# Patient Record
Sex: Female | Born: 1945 | Race: Black or African American | Hispanic: No | Marital: Single | Smoking: Never smoker
Health system: Southern US, Community
[De-identification: ages and names within clinical notes are randomized; demographics above are authoritative.]

## PROBLEM LIST (undated history)

## (undated) DIAGNOSIS — K219 Gastro-esophageal reflux disease without esophagitis: Secondary | ICD-10-CM

## (undated) DIAGNOSIS — E079 Disorder of thyroid, unspecified: Secondary | ICD-10-CM

## (undated) DIAGNOSIS — T7840XA Allergy, unspecified, initial encounter: Secondary | ICD-10-CM

## (undated) DIAGNOSIS — K635 Polyp of colon: Secondary | ICD-10-CM

## (undated) DIAGNOSIS — E785 Hyperlipidemia, unspecified: Secondary | ICD-10-CM

## (undated) HISTORY — PX: UPPER GI ENDOSCOPY: SHX6162

## (undated) HISTORY — DX: Polyp of colon: K63.5

## (undated) HISTORY — DX: Gastro-esophageal reflux disease without esophagitis: K21.9

## (undated) HISTORY — DX: Allergy, unspecified, initial encounter: T78.40XA

## (undated) HISTORY — DX: Hyperlipidemia, unspecified: E78.5

## (undated) HISTORY — DX: Disorder of thyroid, unspecified: E07.9

---

## 1971-03-29 HISTORY — PX: OTHER SURGICAL HISTORY: SHX169

## 2002-03-28 HISTORY — PX: COLONOSCOPY W/ POLYPECTOMY: SHX1380

## 2005-03-28 HISTORY — PX: OTHER SURGICAL HISTORY: SHX169

## 2006-01-30 ENCOUNTER — Encounter: Payer: Self-pay | Admitting: Internal Medicine

## 2006-03-27 ENCOUNTER — Ambulatory Visit (HOSPITAL_COMMUNITY): Admission: RE | Admit: 2006-03-27 | Discharge: 2006-03-27 | Payer: Self-pay | Admitting: Obstetrics and Gynecology

## 2008-03-06 ENCOUNTER — Encounter: Payer: Self-pay | Admitting: Internal Medicine

## 2008-03-13 ENCOUNTER — Encounter: Admission: RE | Admit: 2008-03-13 | Discharge: 2008-03-13 | Payer: Self-pay

## 2008-11-07 ENCOUNTER — Encounter (INDEPENDENT_AMBULATORY_CARE_PROVIDER_SITE_OTHER): Payer: Self-pay | Admitting: *Deleted

## 2008-11-28 ENCOUNTER — Ambulatory Visit (HOSPITAL_COMMUNITY): Admission: RE | Admit: 2008-11-28 | Discharge: 2008-11-28 | Payer: Self-pay | Admitting: Obstetrics and Gynecology

## 2008-12-12 ENCOUNTER — Ambulatory Visit: Payer: Self-pay | Admitting: Internal Medicine

## 2008-12-12 DIAGNOSIS — E785 Hyperlipidemia, unspecified: Secondary | ICD-10-CM | POA: Insufficient documentation

## 2008-12-12 DIAGNOSIS — K219 Gastro-esophageal reflux disease without esophagitis: Secondary | ICD-10-CM | POA: Insufficient documentation

## 2008-12-12 DIAGNOSIS — E039 Hypothyroidism, unspecified: Secondary | ICD-10-CM | POA: Insufficient documentation

## 2008-12-12 DIAGNOSIS — Z860100 Personal history of colon polyps, unspecified: Secondary | ICD-10-CM | POA: Insufficient documentation

## 2008-12-12 DIAGNOSIS — J309 Allergic rhinitis, unspecified: Secondary | ICD-10-CM | POA: Insufficient documentation

## 2008-12-12 DIAGNOSIS — K8020 Calculus of gallbladder without cholecystitis without obstruction: Secondary | ICD-10-CM | POA: Insufficient documentation

## 2008-12-12 DIAGNOSIS — K2270 Barrett's esophagus without dysplasia: Secondary | ICD-10-CM | POA: Insufficient documentation

## 2008-12-14 LAB — CONVERTED CEMR LAB: Vit D, 25-Hydroxy: 41 ng/mL (ref 30–89)

## 2008-12-15 ENCOUNTER — Encounter (INDEPENDENT_AMBULATORY_CARE_PROVIDER_SITE_OTHER): Payer: Self-pay | Admitting: *Deleted

## 2008-12-16 ENCOUNTER — Telehealth (INDEPENDENT_AMBULATORY_CARE_PROVIDER_SITE_OTHER): Payer: Self-pay | Admitting: *Deleted

## 2008-12-16 ENCOUNTER — Encounter (INDEPENDENT_AMBULATORY_CARE_PROVIDER_SITE_OTHER): Payer: Self-pay | Admitting: *Deleted

## 2008-12-16 LAB — CONVERTED CEMR LAB
ALT: 17 units/L (ref 0–35)
AST: 18 units/L (ref 0–37)
Albumin: 3.6 g/dL (ref 3.5–5.2)
Alkaline Phosphatase: 72 units/L (ref 39–117)
BUN: 17 mg/dL (ref 6–23)
Basophils Absolute: 0.1 10*3/uL (ref 0.0–0.1)
Basophils Relative: 1.2 % (ref 0.0–3.0)
Bilirubin, Direct: 0.1 mg/dL (ref 0.0–0.3)
CO2: 32 meq/L (ref 19–32)
Calcium: 9.3 mg/dL (ref 8.4–10.5)
Chloride: 106 meq/L (ref 96–112)
Cholesterol: 193 mg/dL (ref 0–200)
Creatinine, Ser: 0.8 mg/dL (ref 0.4–1.2)
Eosinophils Absolute: 0.1 10*3/uL (ref 0.0–0.7)
Eosinophils Relative: 2.7 % (ref 0.0–5.0)
GFR calc non Af Amer: 93.13 mL/min (ref >60)
Glucose, Bld: 84 mg/dL (ref 70–99)
HCT: 37.1 % (ref 36.0–46.0)
HDL: 40.4 mg/dL (ref >39.00)
Hemoglobin: 12.4 g/dL (ref 12.0–15.0)
LDL Cholesterol: 135 mg/dL — ABNORMAL HIGH (ref 0–99)
Lymphocytes Relative: 47.5 % — ABNORMAL HIGH (ref 12.0–46.0)
Lymphs Abs: 2 10*3/uL (ref 0.7–4.0)
MCHC: 33.4 g/dL (ref 30.0–36.0)
MCV: 93.4 fL (ref 78.0–100.0)
Monocytes Absolute: 0.3 10*3/uL (ref 0.1–1.0)
Monocytes Relative: 5.9 % (ref 3.0–12.0)
Neutro Abs: 1.9 10*3/uL (ref 1.4–7.7)
Neutrophils Relative %: 42.7 % — ABNORMAL LOW (ref 43.0–77.0)
Platelets: 323 10*3/uL (ref 150.0–400.0)
Potassium: 4.3 meq/L (ref 3.5–5.1)
RBC: 3.98 M/uL (ref 3.87–5.11)
RDW: 13.6 % (ref 11.5–14.6)
Sodium: 143 meq/L (ref 135–145)
TSH: 0.1 microintl units/mL — ABNORMAL LOW (ref 0.35–5.50)
Total Bilirubin: 1 mg/dL (ref 0.3–1.2)
Total CHOL/HDL Ratio: 5
Total Protein: 8 g/dL (ref 6.0–8.3)
Triglycerides: 90 mg/dL (ref 0.0–149.0)
VLDL: 18 mg/dL (ref 0.0–40.0)
WBC: 4.4 10*3/uL — ABNORMAL LOW (ref 4.5–10.5)

## 2009-01-05 ENCOUNTER — Telehealth (INDEPENDENT_AMBULATORY_CARE_PROVIDER_SITE_OTHER): Payer: Self-pay | Admitting: *Deleted

## 2009-02-24 ENCOUNTER — Ambulatory Visit: Payer: Self-pay | Admitting: Internal Medicine

## 2009-02-24 LAB — CONVERTED CEMR LAB
ALT: 19 units/L (ref 0–35)
AST: 19 units/L (ref 0–37)
Albumin: 3.6 g/dL (ref 3.5–5.2)
Alkaline Phosphatase: 72 units/L (ref 39–117)
Bilirubin, Direct: 0 mg/dL (ref 0.0–0.3)
Cholesterol: 160 mg/dL (ref 0–200)
HDL: 39.4 mg/dL (ref >39.00)
LDL Cholesterol: 100 mg/dL — ABNORMAL HIGH (ref 0–99)
TSH: 0.39 microintl units/mL (ref 0.35–5.50)
Total Bilirubin: 1 mg/dL (ref 0.3–1.2)
Total CHOL/HDL Ratio: 4
Total Protein: 7.9 g/dL (ref 6.0–8.3)
Triglycerides: 105 mg/dL (ref 0.0–149.0)
VLDL: 21 mg/dL (ref 0.0–40.0)

## 2009-02-27 ENCOUNTER — Ambulatory Visit: Payer: Self-pay | Admitting: Internal Medicine

## 2009-07-08 ENCOUNTER — Ambulatory Visit: Payer: Self-pay | Admitting: Internal Medicine

## 2009-07-08 DIAGNOSIS — E8881 Metabolic syndrome: Secondary | ICD-10-CM | POA: Insufficient documentation

## 2009-07-08 DIAGNOSIS — N959 Unspecified menopausal and perimenopausal disorder: Secondary | ICD-10-CM | POA: Insufficient documentation

## 2009-07-10 ENCOUNTER — Encounter (INDEPENDENT_AMBULATORY_CARE_PROVIDER_SITE_OTHER): Payer: Self-pay | Admitting: *Deleted

## 2009-07-13 LAB — CONVERTED CEMR LAB
Basophils Absolute: 0.1 10*3/uL (ref 0.0–0.1)
Basophils Relative: 1.6 % (ref 0.0–3.0)
Eosinophils Absolute: 0.2 10*3/uL (ref 0.0–0.7)
Eosinophils Relative: 4.2 % (ref 0.0–5.0)
HCT: 35.4 % — ABNORMAL LOW (ref 36.0–46.0)
Hemoglobin: 12 g/dL (ref 12.0–15.0)
Hgb A1c MFr Bld: 5.3 % (ref 4.6–6.5)
Lymphocytes Relative: 38.6 % (ref 12.0–46.0)
Lymphs Abs: 1.7 10*3/uL (ref 0.7–4.0)
MCHC: 34 g/dL (ref 30.0–36.0)
MCV: 92.9 fL (ref 78.0–100.0)
Monocytes Absolute: 0.2 10*3/uL (ref 0.1–1.0)
Monocytes Relative: 5.2 % (ref 3.0–12.0)
Neutro Abs: 2.3 10*3/uL (ref 1.4–7.7)
Neutrophils Relative %: 50.4 % (ref 43.0–77.0)
Platelets: 375 10*3/uL (ref 150.0–400.0)
RBC: 3.81 M/uL — ABNORMAL LOW (ref 3.87–5.11)
RDW: 15 % — ABNORMAL HIGH (ref 11.5–14.6)
TSH: 0.95 microintl units/mL (ref 0.35–5.50)
Vit D, 25-Hydroxy: 32 ng/mL (ref 30–89)
WBC: 4.5 10*3/uL (ref 4.5–10.5)

## 2009-07-27 ENCOUNTER — Telehealth (INDEPENDENT_AMBULATORY_CARE_PROVIDER_SITE_OTHER): Payer: Self-pay | Admitting: *Deleted

## 2009-07-28 ENCOUNTER — Ambulatory Visit: Payer: Self-pay | Admitting: Internal Medicine

## 2009-07-28 LAB — CONVERTED CEMR LAB
Bilirubin Urine: NEGATIVE
Blood in Urine, dipstick: NEGATIVE
Glucose, Urine, Semiquant: NEGATIVE
Nitrite: NEGATIVE
Protein, U semiquant: 100
Specific Gravity, Urine: 1.02
Urobilinogen, UA: 1
WBC Urine, dipstick: NEGATIVE
pH: 5

## 2009-08-03 ENCOUNTER — Telehealth (INDEPENDENT_AMBULATORY_CARE_PROVIDER_SITE_OTHER): Payer: Self-pay | Admitting: *Deleted

## 2009-08-03 LAB — CONVERTED CEMR LAB
ALT: 17 units/L (ref 0–35)
AST: 21 units/L (ref 0–37)
Albumin: 3.6 g/dL (ref 3.5–5.2)
Alkaline Phosphatase: 73 units/L (ref 39–117)
Basophils Absolute: 0.1 10*3/uL (ref 0.0–0.1)
Basophils Relative: 1.7 % (ref 0.0–3.0)
Bilirubin, Direct: 0.1 mg/dL (ref 0.0–0.3)
Cholesterol: 180 mg/dL (ref 0–200)
Eosinophils Absolute: 0.1 10*3/uL (ref 0.0–0.7)
Eosinophils Relative: 3.7 % (ref 0.0–5.0)
Folate: 16.1 ng/mL
Free T4: 1.2 ng/dL (ref 0.6–1.6)
HCT: 35.9 % — ABNORMAL LOW (ref 36.0–46.0)
HDL: 39.4 mg/dL (ref >39.00)
Hemoglobin: 12.2 g/dL (ref 12.0–15.0)
Iron: 95 ug/dL (ref 42–145)
LDL Cholesterol: 122 mg/dL — ABNORMAL HIGH (ref 0–99)
Lymphocytes Relative: 45.1 % (ref 12.0–46.0)
Lymphs Abs: 1.8 10*3/uL (ref 0.7–4.0)
MCHC: 33.9 g/dL (ref 30.0–36.0)
MCV: 93.6 fL (ref 78.0–100.0)
Monocytes Absolute: 0.2 10*3/uL (ref 0.1–1.0)
Monocytes Relative: 6.1 % (ref 3.0–12.0)
Neutro Abs: 1.7 10*3/uL (ref 1.4–7.7)
Neutrophils Relative %: 43.4 % (ref 43.0–77.0)
Platelets: 368 10*3/uL (ref 150.0–400.0)
RBC: 3.83 M/uL — ABNORMAL LOW (ref 3.87–5.11)
RDW: 15.2 % — ABNORMAL HIGH (ref 11.5–14.6)
T3, Free: 2.5 pg/mL (ref 2.3–4.2)
TSH: 1.28 microintl units/mL (ref 0.35–5.50)
Total Bilirubin: 0.7 mg/dL (ref 0.3–1.2)
Total CHOL/HDL Ratio: 5
Total Protein: 7.3 g/dL (ref 6.0–8.3)
Triglycerides: 94 mg/dL (ref 0.0–149.0)
VLDL: 18.8 mg/dL (ref 0.0–40.0)
Vitamin B-12: 827 pg/mL (ref 211–911)
WBC: 4 10*3/uL — ABNORMAL LOW (ref 4.5–10.5)

## 2009-08-06 ENCOUNTER — Telehealth (INDEPENDENT_AMBULATORY_CARE_PROVIDER_SITE_OTHER): Payer: Self-pay | Admitting: *Deleted

## 2009-08-13 ENCOUNTER — Telehealth (INDEPENDENT_AMBULATORY_CARE_PROVIDER_SITE_OTHER): Payer: Self-pay | Admitting: *Deleted

## 2009-10-14 ENCOUNTER — Telehealth (INDEPENDENT_AMBULATORY_CARE_PROVIDER_SITE_OTHER): Payer: Self-pay | Admitting: *Deleted

## 2009-10-16 ENCOUNTER — Ambulatory Visit: Payer: Self-pay | Admitting: Internal Medicine

## 2009-10-16 ENCOUNTER — Telehealth (INDEPENDENT_AMBULATORY_CARE_PROVIDER_SITE_OTHER): Payer: Self-pay | Admitting: *Deleted

## 2009-10-16 DIAGNOSIS — T887XXA Unspecified adverse effect of drug or medicament, initial encounter: Secondary | ICD-10-CM | POA: Insufficient documentation

## 2009-10-16 DIAGNOSIS — M2550 Pain in unspecified joint: Secondary | ICD-10-CM | POA: Insufficient documentation

## 2009-10-19 ENCOUNTER — Telehealth (INDEPENDENT_AMBULATORY_CARE_PROVIDER_SITE_OTHER): Payer: Self-pay | Admitting: *Deleted

## 2009-10-19 LAB — CONVERTED CEMR LAB
Rhuematoid fact SerPl-aCnc: <20 intl units/mL (ref 0–20)
Sed Rate: 48 mm/hr — ABNORMAL HIGH (ref 0–22)
Total CK: 64 units/L (ref 7–177)
Vit D, 25-Hydroxy: 37 ng/mL (ref 30–89)

## 2009-10-21 ENCOUNTER — Ambulatory Visit: Payer: Self-pay | Admitting: Internal Medicine

## 2009-10-21 DIAGNOSIS — E559 Vitamin D deficiency, unspecified: Secondary | ICD-10-CM | POA: Insufficient documentation

## 2009-10-21 DIAGNOSIS — D649 Anemia, unspecified: Secondary | ICD-10-CM | POA: Insufficient documentation

## 2009-10-22 LAB — CONVERTED CEMR LAB
ALT: 16 units/L (ref 0–35)
AST: 17 units/L (ref 0–37)
Albumin: 3.6 g/dL (ref 3.5–5.2)
Alkaline Phosphatase: 68 units/L (ref 39–117)
Basophils Absolute: 0 10*3/uL (ref 0.0–0.1)
Basophils Relative: 1.1 % (ref 0.0–3.0)
Bilirubin, Direct: 0.2 mg/dL (ref 0.0–0.3)
Cholesterol: 183 mg/dL (ref 0–200)
Eosinophils Absolute: 0.2 10*3/uL (ref 0.0–0.7)
Eosinophils Relative: 4.2 % (ref 0.0–5.0)
Folate: 14.1 ng/mL
HCT: 36.5 % (ref 36.0–46.0)
HDL: 40.5 mg/dL (ref >39.00)
Hemoglobin: 12.1 g/dL (ref 12.0–15.0)
Iron: 99 ug/dL (ref 42–145)
LDL Cholesterol: 119 mg/dL — ABNORMAL HIGH (ref 0–99)
Lymphocytes Relative: 46.4 % — ABNORMAL HIGH (ref 12.0–46.0)
Lymphs Abs: 1.8 10*3/uL (ref 0.7–4.0)
MCHC: 33.3 g/dL (ref 30.0–36.0)
MCV: 94.4 fL (ref 78.0–100.0)
Monocytes Absolute: 0.2 10*3/uL (ref 0.1–1.0)
Monocytes Relative: 5 % (ref 3.0–12.0)
Neutro Abs: 1.7 10*3/uL (ref 1.4–7.7)
Neutrophils Relative %: 43.3 % (ref 43.0–77.0)
Platelets: 335 10*3/uL (ref 150.0–400.0)
RBC: 3.87 M/uL (ref 3.87–5.11)
RDW: 14.4 % (ref 11.5–14.6)
Saturation Ratios: 29.2 % (ref 20.0–50.0)
TSH: 0.74 microintl units/mL (ref 0.35–5.50)
Total Bilirubin: 1 mg/dL (ref 0.3–1.2)
Total CHOL/HDL Ratio: 5
Total Protein: 7.1 g/dL (ref 6.0–8.3)
Transferrin: 242.4 mg/dL (ref 212.0–360.0)
Triglycerides: 118 mg/dL (ref 0.0–149.0)
VLDL: 23.6 mg/dL (ref 0.0–40.0)
Vitamin B-12: 574 pg/mL (ref 211–911)
WBC: 4 10*3/uL — ABNORMAL LOW (ref 4.5–10.5)

## 2009-10-27 ENCOUNTER — Telehealth (INDEPENDENT_AMBULATORY_CARE_PROVIDER_SITE_OTHER): Payer: Self-pay | Admitting: *Deleted

## 2009-11-03 ENCOUNTER — Ambulatory Visit: Payer: Self-pay | Admitting: Internal Medicine

## 2009-11-18 ENCOUNTER — Ambulatory Visit (HOSPITAL_COMMUNITY): Admission: RE | Admit: 2009-11-18 | Discharge: 2009-11-18 | Payer: Self-pay | Admitting: Obstetrics and Gynecology

## 2009-12-02 ENCOUNTER — Telehealth (INDEPENDENT_AMBULATORY_CARE_PROVIDER_SITE_OTHER): Payer: Self-pay | Admitting: *Deleted

## 2009-12-07 ENCOUNTER — Telehealth (INDEPENDENT_AMBULATORY_CARE_PROVIDER_SITE_OTHER): Payer: Self-pay | Admitting: *Deleted

## 2009-12-08 ENCOUNTER — Ambulatory Visit: Payer: Self-pay | Admitting: Internal Medicine

## 2010-01-04 ENCOUNTER — Telehealth: Payer: Self-pay | Admitting: Internal Medicine

## 2010-01-04 ENCOUNTER — Encounter: Payer: Self-pay | Admitting: Internal Medicine

## 2010-02-21 ENCOUNTER — Encounter: Payer: Self-pay | Admitting: Internal Medicine

## 2010-02-25 ENCOUNTER — Ambulatory Visit: Payer: Self-pay | Admitting: Internal Medicine

## 2010-02-25 ENCOUNTER — Telehealth: Payer: Self-pay | Admitting: Internal Medicine

## 2010-03-01 LAB — CONVERTED CEMR LAB
ALT: 19 units/L (ref 0–35)
AST: 19 units/L (ref 0–37)
Albumin: 3.7 g/dL (ref 3.5–5.2)
Alkaline Phosphatase: 75 units/L (ref 39–117)
BUN: 14 mg/dL (ref 6–23)
Basophils Absolute: 0 10*3/uL (ref 0.0–0.1)
Basophils Relative: 0.7 % (ref 0.0–3.0)
Bilirubin, Direct: 0.2 mg/dL (ref 0.0–0.3)
CO2: 30 meq/L (ref 19–32)
Calcium: 9.2 mg/dL (ref 8.4–10.5)
Chloride: 102 meq/L (ref 96–112)
Cholesterol: 148 mg/dL (ref 0–200)
Creatinine, Ser: 0.9 mg/dL (ref 0.4–1.2)
Eosinophils Absolute: 0.1 10*3/uL (ref 0.0–0.7)
Eosinophils Relative: 2.5 % (ref 0.0–5.0)
GFR calc non Af Amer: 86.5 mL/min (ref >60)
Glucose, Bld: 97 mg/dL (ref 70–99)
HCT: 35.2 % — ABNORMAL LOW (ref 36.0–46.0)
HDL: 39.1 mg/dL (ref >39.00)
Hemoglobin: 11.8 g/dL — ABNORMAL LOW (ref 12.0–15.0)
LDL Cholesterol: 95 mg/dL (ref 0–99)
Lymphocytes Relative: 38.8 % (ref 12.0–46.0)
Lymphs Abs: 1.8 10*3/uL (ref 0.7–4.0)
MCHC: 33.7 g/dL (ref 30.0–36.0)
MCV: 93.9 fL (ref 78.0–100.0)
Monocytes Absolute: 0.2 10*3/uL (ref 0.1–1.0)
Monocytes Relative: 5.2 % (ref 3.0–12.0)
Neutro Abs: 2.5 10*3/uL (ref 1.4–7.7)
Neutrophils Relative %: 52.8 % (ref 43.0–77.0)
Platelets: 316 10*3/uL (ref 150.0–400.0)
Potassium: 4.3 meq/L (ref 3.5–5.1)
RBC: 3.75 M/uL — ABNORMAL LOW (ref 3.87–5.11)
RDW: 14.1 % (ref 11.5–14.6)
Sodium: 140 meq/L (ref 135–145)
TSH: 0.42 microintl units/mL (ref 0.35–5.50)
Total Bilirubin: 1 mg/dL (ref 0.3–1.2)
Total CHOL/HDL Ratio: 4
Total Protein: 7.1 g/dL (ref 6.0–8.3)
Triglycerides: 72 mg/dL (ref 0.0–149.0)
VLDL: 14.4 mg/dL (ref 0.0–40.0)
Vit D, 25-Hydroxy: 44 ng/mL (ref 30–89)
WBC: 4.7 10*3/uL (ref 4.5–10.5)

## 2010-03-04 ENCOUNTER — Encounter: Payer: Self-pay | Admitting: Internal Medicine

## 2010-03-04 ENCOUNTER — Ambulatory Visit: Payer: Self-pay | Admitting: Internal Medicine

## 2010-03-04 DIAGNOSIS — J209 Acute bronchitis, unspecified: Secondary | ICD-10-CM | POA: Insufficient documentation

## 2010-03-04 LAB — CONVERTED CEMR LAB
Cholesterol, target level: 200 mg/dL
HDL goal, serum: 40 mg/dL
LDL Goal: 130 mg/dL

## 2010-03-06 LAB — CONVERTED CEMR LAB
Basophils Absolute: 0 10*3/uL (ref 0.0–0.1)
Basophils Relative: 0.6 % (ref 0.0–3.0)
Eosinophils Absolute: 0.2 10*3/uL (ref 0.0–0.7)
Eosinophils Relative: 4 % (ref 0.0–5.0)
Folate: 13.1 ng/mL
HCT: 36.3 % (ref 36.0–46.0)
Hemoglobin: 12.1 g/dL (ref 12.0–15.0)
Iron: 55 ug/dL (ref 42–145)
Lymphocytes Relative: 66.9 % — ABNORMAL HIGH (ref 12.0–46.0)
Lymphs Abs: 2.9 10*3/uL (ref 0.7–4.0)
MCHC: 33.5 g/dL (ref 30.0–36.0)
MCV: 95.1 fL (ref 78.0–100.0)
Monocytes Absolute: 0.3 10*3/uL (ref 0.1–1.0)
Monocytes Relative: 6.9 % (ref 3.0–12.0)
Neutro Abs: 0.9 10*3/uL — ABNORMAL LOW (ref 1.4–7.7)
Neutrophils Relative %: 21.6 % — ABNORMAL LOW (ref 43.0–77.0)
Platelets: 338 10*3/uL (ref 150.0–400.0)
RBC: 3.81 M/uL — ABNORMAL LOW (ref 3.87–5.11)
RDW: 14.2 % (ref 11.5–14.6)
Saturation Ratios: 14.5 % — ABNORMAL LOW (ref 20.0–50.0)
Transferrin: 270.1 mg/dL (ref 212.0–360.0)
Vitamin B-12: 706 pg/mL (ref 211–911)
WBC: 4.4 10*3/uL — ABNORMAL LOW (ref 4.5–10.5)

## 2010-03-19 ENCOUNTER — Encounter: Payer: Self-pay | Admitting: Internal Medicine

## 2010-03-19 ENCOUNTER — Ambulatory Visit: Payer: Self-pay | Admitting: Internal Medicine

## 2010-03-19 DIAGNOSIS — R109 Unspecified abdominal pain: Secondary | ICD-10-CM | POA: Insufficient documentation

## 2010-03-19 DIAGNOSIS — R3129 Other microscopic hematuria: Secondary | ICD-10-CM | POA: Insufficient documentation

## 2010-03-19 LAB — CONVERTED CEMR LAB
Bilirubin Urine: NEGATIVE
Glucose, Urine, Semiquant: NEGATIVE
Ketones, urine, test strip: NEGATIVE
Nitrite: NEGATIVE
Protein, U semiquant: NEGATIVE
Specific Gravity, Urine: >=1.03
Urobilinogen, UA: 0.2
WBC Urine, dipstick: NEGATIVE
pH: 6

## 2010-04-19 ENCOUNTER — Encounter: Payer: Self-pay | Admitting: Obstetrics and Gynecology

## 2010-04-27 NOTE — Letter (Signed)
Summary: Results Follow up Letter   at [place]  [address] [place], [place] [address]   Phone: [phone]  Fax: [phone]    12/15/2008 MRN: [id number]  Kristin Banks [address] [place], [place]  [address]  Dear Ms. Kristin Banks,  The following are the results of your recent test(s):  Test         Result    Pap Smear:        Normal _____  Not Normal _____ Comments: ______________________________________________________ Cholesterol: LDL(Bad cholesterol):         Your goal is less than:         HDL (Good cholesterol):       Your goal is more than: Comments:  ______________________________________________________ Mammogram:        Normal _____  Not Normal _____ Comments:  ___________________________________________________________________ Hemoccult:        Normal _____  Not normal _______ Comments:    _____________________________________________________________________ Other Tests: Please see attached labs done on 12/12/2008    We routinely do not discuss normal results over the telephone.  If you desire a copy of the results, or you have any questions about this information we can discuss them at your next office visit.   Sincerely,

## 2010-04-27 NOTE — Progress Notes (Signed)
Summary: Triage: Request to speak with someone  Phone Note Call from Patient Call back at Home Phone [phone]   Caller: Patient Summary of Call: Message left on Voicemail: Patient would like for someone to call her (no futher detail given)   Chrae Malloy CMA  December 02, 2009 1:33 PM   Follow-up for Phone Call         pt states that she has met her deductible so she can change back to singular. Pt would like to have rx for SINGULAIR 10 MG TABS (MONTELUKAST SODIUM) 1 once daily as needed sent to  [facility] [address] 3 month supply..........Marland KitchenFelecia Deloach CMA  December 02, 2009 2:36 PM   pt states that the form that was sent to the [facility] was not received pt would like to have form resubmitted to fax [phone] attn cindy. pt advise form faxed and copy mailed to pt for her record.........Marland KitchenFelecia Deloach CMA  December 02, 2009 2:39 PM      New/Updated Medications: SINGULAIR 10 MG TABS (MONTELUKAST SODIUM) 1 by mouth once daily as needed Prescriptions: SINGULAIR 10 MG TABS (MONTELUKAST SODIUM) 1 by mouth once daily as needed  #90 x 1   Entered by:   Shonna Chock CMA   Authorized by:   Marga Melnick MD   Signed by:   Shonna Chock CMA on 12/02/2009   Method used:   Electronically to        [facility] [phone]* (retail)       [address]       [place], [place]  [address]       Ph: [phone]       Fax: [phone]   RxID:   [id number]

## 2010-04-27 NOTE — Letter (Signed)
Summary: Verification of Disability Form/[facility] [employer] [facility]  Verification of Disability Form/[employer] [facility] [employer] [facility]   Imported By: Lanelle Bal 10/28/2009 14:21:01  _____________________________________________________________________  External Attachment:    Type:   Image     Comment:   External Document

## 2010-04-27 NOTE — Assessment & Plan Note (Signed)
Summary: Follow-up and fasting for labs/scm   Vital Signs:  Patient profile:   65 year old female Weight:      234.6 pounds Pulse rate:   74 / minute Resp:     15 per minute BP sitting:   114 / 78  (left arm) Cuff size:   large  Vitals Entered By: Shonna Chock (July 08, 2009 8:19 AM) CC: Follow-up visit: (fasting for labs), URI symptoms Comments REVIEWED MED LIST, PATIENT AGREED DOSE AND INSTRUCTION CORRECT    CC:  Follow-up visit: (fasting for labs) and URI symptoms.  History of Present Illness:  URI Symptoms      This is a 65 year old woman who presents with URI symptoms with onset after trip to [place] 06/21/2009 . Onset rhinitis.  The patient reports nasal congestion, purulent nasal discharge, and productive cough with green sputum, but denies sore throat and earache.  The patient denies fever, dyspnea, wheezing, rash, vomiting, and diarrhea.  The patient denies itchy watery eyes, sneezing, headache, muscle aches, and severe fatigue.  The patient denies the following risk factors for Strep sinusitis: unilateral facial pain and tooth pain.                                                                                                      She is also here for thyroid monitor; see ROS.  Allergies: No Known Drug Allergies  Review of Systems General:  Denies fatigue and weight loss. Eyes:  Denies blurring, double vision, and vision loss-both eyes. ENT:  Denies difficulty swallowing and hoarseness. CV:  Denies palpitations. GI:  Denies constipation and diarrhea. Derm:  Complains of changes in nail beds; denies dryness and hair loss; Nails soft. Neuro:  Denies numbness and tingling. Endo:  Denies cold intolerance and heat intolerance.  Physical Exam  General:  well-nourished; alert,appropriate and cooperative throughout examination Eyes:  No corneal or conjunctival inflammation noted. Perrla. No lid lag Ears:  External ear exam shows no significant lesions or deformities.   Otoscopic examination reveals clear canals, tympanic membranes are intact bilaterally without bulging, retraction, inflammation or discharge. Hearing is grossly normal bilaterally. Nose:  External nasal examination shows no deformity or inflammation. Nasal mucosa are pink and moist without lesions or exudates. Mouth:  Oral mucosa and oropharynx without lesions or exudates.  Teeth in good repair. Neck:  No deformities, masses, or tenderness noted. Lungs:  Normal respiratory effort, chest expands symmetrically. Lungs are clear to auscultation, no crackles or wheezes. Heart:  Normal rate and regular rhythm. S1 and S2 normal without gallop, murmur, click, rub.S4 with slurring Skin:  Intact without suspicious lesions or rashes Cervical Nodes:  No lymphadenopathy noted Axillary Nodes:  No palpable lymphadenopathy Psych:  memory intact for recent and remote, normally interactive, and good eye contact.     Impression & Recommendations:  Problem # 1:  URI (ICD-465.9)  Her updated medication list for this problem includes:    Aspirin 81 Mg Tabs (Aspirin) .Marland Kitchen... 1 by mouth a couple x weekly  Orders: Venipuncture ([address]) TLB-CBC Platelet - w/Differential (85025-CBCD)  Problem #  2:  HYPOTHYROIDISM (ICD-244.9)  Her updated medication list for this problem includes:    Synthroid 112 Mcg Tabs (Levothyroxine sodium) .Marland Kitchen... 1 by mouth once daily except 1/2 on weds  Orders: Venipuncture ([address]) TLB-TSH (Thyroid Stimulating Hormone) (84443-TSH) TLB-A1C / Hgb A1C (Glycohemoglobin) [id number]) Radiology Referral (Radiology)  Problem # 3:  POSTMENOPAUSAL SYNDROME (ICD-627.9)  Orders: Venipuncture ([address]) T-Vitamin D (25-Hydroxy) ([address]-62130) Radiology Referral (Radiology)  Problem # 4:  VITAMIN D DEFICIENCY (ICD-268.9)  Orders: Venipuncture ([address]) T-Vitamin D (25-Hydroxy) ([address]-95284) Radiology Referral (Radiology)  Complete Medication List: 1)  Synthroid 112 Mcg Tabs (Levothyroxine  sodium) .Marland Kitchen.. 1 by mouth once daily except 1/2 on weds 2)  Prevacid 30 Mg Cpdr (Lansoprazole) .Marland Kitchen.. 1 by mouth once daily (patient will take this or nexium) 3)  Vitamin D3 1000 Unit Caps (Cholecalciferol) .Marland Kitchen.. 1 by mouth once daily 4)  Centrum Tabs (Multiple vitamins-minerals) .Marland Kitchen.. 1 by mouth once daily 5)  Aloe Vera 470 Mg Caps (Aloe vera) .Marland Kitchen.. 1 by mouth once daily 6)  Acidophilus Caps (Lactobacillus) .Marland Kitchen.. 1 by mouth once daily 7)  Folic Acid 800 Mcg Tabs (Folic acid) .... Couple of times weekly 8)  Biotin 300 Mcg Tabs (Biotin) .... Couple of times weekly 9)  Aspirin 81 Mg Tabs (Aspirin) .Marland Kitchen.. 1 by mouth a couple x weekly 10)  Simvastatin 20 Mg Tabs (Simvastatin) .Marland Kitchen.. 1 at bedtime 11)  Dexilant 60 Mg Cpdr (Dexlansoprazole) .Marland Kitchen.. 1 once daily 12)  Singulair 10 Mg Tabs (Montelukast sodium) .Marland Kitchen.. 1 once daily as needed 13)  Amoxicillin 500 Mg Caps (Amoxicillin) .Marland Kitchen.. 1 three times a day  Patient Instructions: 1)  Nutritional interventions as discussed. 2)  It is important that you exercise regularly at least 20 minutes 5 times a week. If you develop chest pain, have severe difficulty breathing, or feel very tired , stop exercising immediately and seek medical attention. 3)  Drink as much fluid as you can tolerate for the next few days. Neti  pot once daily until sinuses clear. Prescriptions: AMOXICILLIN 500 MG CAPS (AMOXICILLIN) 1 three times a day  #30 x 0   Entered and Authorized by:   Marga Melnick MD   Signed by:   Marga Melnick MD on 07/08/2009   Method used:   Print then Give to Patient   RxID:   [id number] SYNTHROID 112 MCG TABS (LEVOTHYROXINE SODIUM) 1 by mouth once daily EXCEPT 1/2 on Weds  #90 x 2   Entered and Authorized by:   Marga Melnick MD   Signed by:   Marga Melnick MD on 07/08/2009   Method used:   Print then Give to Patient   RxID:   [id number] SIMVASTATIN 20 MG TABS (SIMVASTATIN) 1 at bedtime  #90 x 2   Entered and Authorized by:   Marga Melnick MD    Signed by:   Marga Melnick MD on 07/08/2009   Method used:   Print then Give to Patient   RxID:   [id number]

## 2010-04-27 NOTE — Progress Notes (Signed)
Summary: lab order   Phone Note Call from Patient Call back at Work Phone [phone]   Caller: Patient Reason for Call: Referral Summary of Call: pt has an appt to come in and do labs before her cpx. please provide orders. she is on the lab schedule for 9/13 @ 8am Initial call taken by: Lavell Islam,  October 14, 2009 3:21 PM  Follow-up for Phone Call        patient also left msg on VM says she has been having joint pain in knees and wanted to cancel her gym membership since she is unable to work out do to joint pain but needed Dr.'s note but she contacted pharmacy to see if cholesterol med could be causing symptoms and was informed that this could also be a factor and wanted to know what she should do?  left message on machine ........Marland KitchenDoristine Devoid CMA  October 14, 2009 3:48 PM   Additional Follow-up for Phone Call Additional follow up Details #1::         Cholesterol meds  @ high dose have 1 chance in 10,000 of causing muscle irritation , not joint pain. Please check CPK, sed rate, RA , vitamin D level (codes : arthralgias, 627.9, 995.20) Additional Follow-up by: Marga Melnick MD,  October 14, 2009 5:01 PM    Additional Follow-up for Phone Call Additional follow up Details #2::    patient is scheduled for cpx labs - per append [id number] patient was to rechedule lipid,hep:272.4 - 995.20 in 3 months - should i add this to cpx lab order - please add addl  lab for cpx .Marland KitchenOkey Regal Spring  October 15, 2009 10:48 AM   Additional Follow-up for Phone Call Additional follow up Details #3:: Details for Additional Follow-up Action Taken: these labs should be done now  because of her active/ acute  symptoms ; the usual CPX labs  can be done later. Marga Melnick MD,  October 16, 2009 5:48 AM   these labs scheduled [id number] CPK, sed rate, RA , vitamin D level (codes : arthralgias, 627.9, 995.20) but i stll need cpx lab order for [phone]   .Marland KitchenOkey Regal Spring  October 16, 2009 11:31 AM  I spoke with patient and she will  drop off a form from her gym that she will pick up and bring in today./Chrae [facility] CMA  October 16, 2009 11:42 AM

## 2010-04-27 NOTE — Progress Notes (Signed)
  Phone Note Call from Patient   Caller: Patient    Prescriptions: SYNTHROID 112 MCG TABS (LEVOTHYROXINE SODIUM) 1 by mouth once daily EXCEPT 1/2 on Weds  #90 x 0   Entered by:   Kandice Hams   Authorized by:   Marga Melnick MD   Signed by:   Kandice Hams on 12/16/2008   Method used:   Faxed to ...       [facility] [address] (retail)       [phone]       [place], [place]  [address]       Ph: [phone]       Fax: [phone]   RxID:   [id number]

## 2010-04-27 NOTE — Progress Notes (Signed)
Summary: [place] ABOUT LOW THYROID  Phone Note Call from Patient   Caller: Patient Summary of Call: CALL ON TUESDAY---HOME UNTIL 9:30AM = [phone],   WORK -- 10 TO 7PM = [phone]  SHE HAD LABS FROM HER OFFICE VISIT 3 WEEKS AGO---SAID THYROID WAS LOW----DOES SHE NEED TO GO TO A ENDOCRONOLOGIST?? Initial call taken by: Jerolyn Shin,  January 05, 2009 4:38 PM  Follow-up for Phone Call        I left message on machine informing patient, Dr.Hopper has not indicated that she needs to see Endo, we usually refer to Endo once we are unable to keep thyroid at goal. If paitent would like to consider an Endo Dr. we will be glad to refer otherwise Dr.Hopper will continue to monitor. Patient to call if any futher questions or concerns Follow-up by: Shonna Chock,  January 06, 2009 9:43 AM

## 2010-04-27 NOTE — Progress Notes (Signed)
Summary: Lab Concerns  Phone Note Call from Patient Call back at Work Phone [phone]   Caller: Patient Summary of Call: patient has questions about lab Initial call taken by: Okey Regal Spring,  Jul 27, 2009 11:50 AM  Follow-up for Phone Call        Left message informing patient to please call to discuss her concerns about her labs  Follow-up by: Shonna Chock,  Jul 27, 2009 12:21 PM  Additional Follow-up for Phone Call Additional follow up Details #1::        1.) Patient called to say with no activity sthe still feels like she is trying to catch her breath, patient ?'s is this is related to her thyroid. Although normal she notinced that it is a low normal.  I offered patient appointment to discuss-refused and asked if I could ask Dr.Hopper First. Additional Follow-up by: Shonna Chock,  Jul 27, 2009 2:16 PM    Additional Follow-up for Phone Call Additional follow up Details #2::    Recommend : CBC & dif, iron panel, B12 & folate ;stool cards; free T4, free T4 &  repeat TSH . Codes : 786.05,794.5,285.9 Follow-up by: Marga Melnick MD,  Jul 27, 2009 5:22 PM  Additional Follow-up for Phone Call Additional follow up Details #3:: Details for Additional Follow-up Action Taken: Spoke with patient and she is aware additional labs added to order for tomorrow, patient then stated she is not really having a time catching her breath, its some kind of feeling in her throat.  Dr.Hopper was standing in front of me and overheard patient and still recommended that she get additional labs and we go from there./Chrae [facility]  Jul 27, 2009 5:29 PM

## 2010-04-27 NOTE — Letter (Signed)
Summary: Primary Care Consult Scheduled Letter  [place] [place]  [address] [place], [place] [address]   Phone: [phone]  Fax: [phone]      12/16/2008 MRN: [id number]  Kristin Banks [address] [place], [place]  [address]    Dear [name],      We have scheduled an appointment for you.  At the recommendation of Dr. Marga Melnick, we have scheduled you a consult with Dr. Russella Dar with [facility] on 01-14-09 at 9:30am.  Their address is [address] [place], [place] [address]. The office phone number is [phone].  If this appointment day and time is not convenient for you, please feel free to call the office of the doctor you are being referred to at the number listed above and reschedule the appointment.     It is important for you to keep your scheduled appointments. We are here to make sure you are given good patient care. If you have questions or you have made changes to your appointment, please notify us at  [phone], ask for Renee.    Thank you,  Patient Care Coordinator [place] [place]    **IF YOU ARE UNABLE TO KEEP THIS APPOINTMENT OR NEED TO RESCHEDULE,PLEASE GIVE DR. STARK'S OFFICE 24 HOUR NOTICE TO AVOID A $50 FEE**

## 2010-04-27 NOTE — Assessment & Plan Note (Signed)
Summary: discuss labs/kdc   Vital Signs:  Patient profile:   65 year old female Weight:      233.0 pounds Pulse rate:   76 / minute Resp:     15 per minute BP sitting:   124 / 72  (left arm) Cuff size:   large  Vitals Entered By: Shonna Chock (February 27, 2009 8:11 AM) CC: Follow-up visit: discuss labs(COPY GIVEN), Discuss getting another RX for acid reflux, Hemmocult Cards, Pressure to have BM but sometimes nothing comes Comments REVIEWED MED LIST, PATIENT AGREED DOSE AND INSTRUCTION CORRECT    CC:  Follow-up visit: discuss labs(COPY GIVEN), Discuss getting another RX for acid reflux, Hemmocult Cards, and Pressure to have BM but sometimes nothing comes.  History of Present Illness: Labs reviewed & risks discussed; LDL risk 35% improvede. CVE as bike, Cybex , & elliptical X 60 min X3 / week w/o symptoms. Diet: increased fruits & vegetables.  Allergies (verified): No Known Drug Allergies  Past History:  Past Medical History: Hypothyroidism GERD/ Barrett's Esophagus; PMH of H pylori gastritis ,treated 2008, Dr Jodi Marble, [facility] Allergic rhinitis Colonic polyps, hx of X 4 since 1989, adenomatous & hyperplastic Uterine Fibroids, DrAnderson Hyperlipidemia: LDL goal = < 90(ideally < 70) based on NMR Lipoprofile 03/06/2008( LDL 138 with 2162 total & 1610 small dense particles for approx 21 % risk)TG 122, HDL 41 Cholelithiasis; Simple hepatic cysts  as per [place] 01/2006 Vitamoin D def (28.8 on 03/06/2008)  Review of Systems CV:  Denies chest pain or discomfort, leg cramps with exertion, palpitations, shortness of breath with exertion, swelling of feet, and swelling of hands.  Physical Exam  General:  in no acute distress; alert,appropriate and cooperative throughout examination Lungs:  Normal respiratory effort, chest expands symmetrically. Lungs are clear to auscultation, no crackles or wheezes. Heart:  Normal rate and regular rhythm. S1 and S2 normal without gallop, murmur, click, rub  . S4 with slurring  Pulses:  R and L carotid,radial,dorsalis pedis and posterior tibial pulses are full and equal bilaterally Psych:  Intelligent  & focused   Impression & Recommendations:  Problem # 1:  HYPERLIPIDEMIA (ICD-272.4)  The following medications were removed from the medication list:    Pravastatin Sodium 20 Mg Tabs (Pravastatin sodium) .Marland Kitchen... 1 at bedtime Her updated medication list for this problem includes:    Simvastatin 20 Mg Tabs (Simvastatin) .Marland Kitchen... 1 at bedtime  Problem # 2:  HYPOTHYROIDISM (ICD-244.9)  Her updated medication list for this problem includes:    Synthroid 112 Mcg Tabs (Levothyroxine sodium) .Marland Kitchen... 1 by mouth once daily except 1/2 on weds  Complete Medication List: 1)  Synthroid 112 Mcg Tabs (Levothyroxine sodium) .Marland Kitchen.. 1 by mouth once daily except 1/2 on weds 2)  Prevacid 30 Mg Cpdr (Lansoprazole) .Marland Kitchen.. 1 by mouth once daily (patient will take this or nexium) 3)  Vitamin D3 1000 Unit Caps (Cholecalciferol) .Marland Kitchen.. 1 by mouth once daily 4)  Centrum Tabs (Multiple vitamins-minerals) .Marland Kitchen.. 1 by mouth once daily 5)  Aloe Vera 470 Mg Caps (Aloe vera) .Marland Kitchen.. 1 by mouth once daily 6)  Acidophilus Caps (Lactobacillus) .Marland Kitchen.. 1 by mouth once daily 7)  Folic Acid 800 Mcg Tabs (Folic acid) .... Couple of times weekly 8)  Biotin 300 Mcg Tabs (Biotin) .... Couple of times weekly 9)  Aspirin 81 Mg Tabs (Aspirin) .Marland Kitchen.. 1 by mouth a couple x weekly 10)  Simvastatin 20 Mg Tabs (Simvastatin) .Marland Kitchen.. 1 at bedtime 11)  Dexilant 60 Mg Cpdr (Dexlansoprazole) .Marland KitchenMarland KitchenMarland Kitchen 1  once daily 12)  Singulair 10 Mg Tabs (Montelukast sodium) .Marland Kitchen.. 1 once daily as needed  Patient Instructions: 1)  Follow guidelines of 40 oz water/ day; focus on <35 inches; 30 min walking 3X/week; < 25 grams of sugar / day from labeled foods & drinks. Your LDL goal < 90,ideally < 70. 2)  Please schedule a follow-up appointment in 3 months. 3)  Hepatic Panel prior to visit, ICD-9:995.20 4)  Lipid Panel prior to visit,  ICD-9:272.4 5)  TSH prior to visit, ICD-9:244.9 Prescriptions: SINGULAIR 10 MG TABS (MONTELUKAST SODIUM) 1 once daily as needed  #90 x 3   Entered and Authorized by:   Marga Melnick MD   Signed by:   Marga Melnick MD on 02/27/2009   Method used:   Print then Give to Patient   RxID:   [id number] DEXILANT 60 MG CPDR (DEXLANSOPRAZOLE) 1 once daily  #90 x 1   Entered and Authorized by:   Marga Melnick MD   Signed by:   Marga Melnick MD on 02/27/2009   Method used:   Print then Give to Patient   RxID:   [id number] SIMVASTATIN 20 MG TABS (SIMVASTATIN) 1 at bedtime  #90 x 1   Entered and Authorized by:   Marga Melnick MD   Signed by:   Marga Melnick MD on 02/27/2009   Method used:   Print then Give to Patient   RxID:   [id number]

## 2010-04-27 NOTE — Progress Notes (Signed)
Summary: Request for results  Phone Note Call from Patient Call back at Home Phone [phone]   Caller: Patient Summary of Call: Message left on VM: Patient got a bill for her labwork but no results-would like a call, patient stated ok to leave message on VM at home number.   Left message on patient's home number VM:  Anemia is stable with normal iron , B12 & folate levels.LDL or BAD cholesterol goal = @ least  < 100; please increase Simvastatin to 40 mg at bedtime (this is average dose). Recheck fasting labs after 3 months (lipids, hep panel; 272.4,995.20). Because of your past Gi history you should see Gastroenterologist for  periodic esophagus & colon monitor. Please let me know if you want me to schedule GI followup or if you will do so. Hopp  I also informed patient labs mailed today and she should receive soon. Patient to call if any questions or concerns./Chrae [facility]  Aug 03, 2009 4:47 PM

## 2010-04-27 NOTE — Assessment & Plan Note (Signed)
Summary: CPX//PH   Vital Signs:  Patient profile:   65 year old female Height:      62.5 inches Weight:      230.6 pounds BMI:     41.66 Temp:     97.8 degrees F oral Pulse rate:   80 / minute Resp:     14 per minute BP sitting:   122 / 76  (left arm) Cuff size:   large  Vitals Entered By: Shonna Chock CMA (March 04, 2010 2:16 PM) CC: CPX and discuss labs (with mailed copy), Lipid Management   CC:  CPX and discuss labs (with mailed copy) and Lipid Management.  History of Present Illness:   Kristin Banks is here for a physical; she has had increased ERD symptoms after EGD 02/21/2010 in [place], [place]. EGD revealed esophagitis/ carditis. Nexium will be changed to Dexilant by Dr Jodi Marble.     Hyperlipidemia Follow-Up:  The patient denies muscle aches, flushing, itching, constipation, diarrhea, and fatigue.  Other symptoms include dypsnea only initially with exercise  and intermittent pedal edema.  The patient denies the following symptoms: chest pain/pressure, palpitations, and syncope.  Compliance with medications (by patient report) has been intermittent, she takes statin 4X/week on average. It causes headache the next am. Dietary compliance has been good.  The patient reports no exercise due to knee pain for which she saw Dr Charlett Blake.  Adjunctive measures currently used by the patient include ASA and fish oil supplements.    Lipid Management History:      Positive NCEP/ATP III risk factors include female age 65 years old or older and HDL cholesterol less than 40.  Negative NCEP/ATP III risk factors include non-diabetic, no family history for ischemic heart disease, non-tobacco-user status, non-hypertensive, no ASHD (atherosclerotic heart disease), no prior stroke/TIA, no peripheral vascular disease, and no history of aortic aneurysm.     Current Medications (verified): 1)  Synthroid 112 Mcg Tabs (Levothyroxine Sodium) .Marland Kitchen.. 1 By Mouth Once Daily Except 1/2 On Weds 2)  Vitamin D3 1000 Unit Caps  (Cholecalciferol) .... 2 By Mouth Once Daily 3)  Centrum  Tabs (Multiple Vitamins-Minerals) .Marland Kitchen.. 1 By Mouth Once Daily 4)  Aloe Vera 470 Mg Caps (Aloe Vera) .Marland Kitchen.. 1 By Mouth Once Daily 5)  Acidophilus  Caps (Lactobacillus) .Marland Kitchen.. 1 By Mouth Once Daily 6)  Aspirin 81 Mg Tabs (Aspirin) .Marland Kitchen.. 1 By Mouth A Couple X Weekly 7)  Simvastatin 40 Mg Tabs (Simvastatin) .Marland Kitchen.. 1 At Bedtime 8)  Nexium 20 Mg Cpdr (Esomeprazole Magnesium) .Marland Kitchen.. 1 By Mouth Two Times A Day 9)  Furosemide 20 Mg Tabs (Furosemide) .Marland Kitchen.. 1 By Mouth Once Daily As Needed 10)  Fish Oil 1000 Mg Caps (Omega-3 Fatty Acids) .Marland Kitchen.. 1 By Mouth Once Daily 11)  Singulair 10 Mg Tabs (Montelukast Sodium) .Marland Kitchen.. 1 By Mouth Once Daily As Needed  Allergies (verified): No Known Drug Allergies  Past History:  Past Medical History: Hypothyroidism GERD/ Barrett's Esophagus; PMH of, H pylori gastritis ,treated 2008, Dr  Elbert Ewings, [facility]( Past Rxs: Prevacid, Protonix,Zantac, Dexilant. Nexium & Prevacid most effective to date) Allergic rhinitis Colonic polyps, PMH  of X 4 since 1989, adenomatous & hyperplastic Uterine Fibroids, Dr Malva Limes Hyperlipidemia: LDL goal = < 90(ideally < 70) based on NMR Lipoprofile 03/06/2008( LDL 138 with 2162 total & 1027 small dense particles for approx 21 % risk)TG 122, HDL 41.  [place] Study LDL goal = < 130. Cholelithiasis; Simple hepatic cysts  as per [place] 01/2006 Vitamin D def (28.8  on 03/06/2008)  Past Surgical History: Endoscopy X4 for monitor of possible  Barrett's Colon polypectomy X 4 Arthroscopy for meniscal tear & cyst 2007, Dr Marina Gravel; now seeing Dr Charlett Blake  Family History: Father: deceased, bone cancer Mother: deceased, lung cancer & thyroid cancer,hyperthyroidism;MGM: arthritis, cervical cancer; Siblings: sister:  deceased  ovarian cancer, metastatic to colon  Social History: Occupation: Production designer, theatre/television/film @ [employer] Divorced Never Smoked Alcohol use-yes: RARELY   Review of Systems  The patient  denies anorexia, fever, vision loss, decreased hearing, hoarseness, melena, hematochezia, hematuria, suspicious skin lesions, depression, unusual weight change, abnormal bleeding, enlarged lymph nodes, and angioedema.         Weight down 7-9#. Resp:  Complains of cough and sputum productive; denies coughing up blood, excessive snoring, and hypersomnolence; Bronchitic symptoms X 2 weeks.  Physical Exam  General:  well-nourished;alert,appropriate and cooperative throughout examination Head:  Normocephalic and atraumatic without obvious abnormalities.  Eyes:  No corneal or conjunctival inflammation noted. Perrla. Funduscopic exam benign, without hemorrhages, exudates or papilledema. Ptosis OS Ears:  External ear exam shows no significant lesions or deformities.  Otoscopic examination reveals clear canals, tympanic membranes are intact bilaterally without bulging, retraction, inflammation or discharge. Hearing is grossly normal bilaterally. Nose:  External nasal examination shows no deformity or inflammation. Nasal mucosa are pink and moist without lesions or exudates. Mouth:  Oral mucosa and oropharynx without lesions or exudates.  Teeth in good repair. Neck:  No deformities, masses, or tenderness noted. Lungs:  Normal respiratory effort, chest expands symmetrically. Lungs are clear to auscultation, no crackles or wheezes. Heart:  Normal rate and regular rhythm. S1 and S2 normal without gallop, murmur, click, rub. S 4  Abdomen:  Bowel sounds positive,abdomen soft and non-tender without masses, organomegaly or hernias noted. Genitalia:  Dr Dareen Piano Msk:  No deformity or scoliosis noted of thoracic or lumbar spine.   Pulses:  R and L carotid,radial,dorsalis pedis and posterior tibial pulses are full and equal bilaterally Extremities:  No clubbing, cyanosis, edema. Varus knee deformity with crepitus R > L. Neurologic:  alert & oriented X3 and DTR decreased R knee Skin:  Intact without suspicious  lesions or rashes Cervical Nodes:  No lymphadenopathy noted Axillary Nodes:  No palpable lymphadenopathy Psych:  memory intact for recent and remote, normally interactive, and good eye contact.     Impression & Recommendations:  Problem # 1:  ROUTINE GENERAL MEDICAL EXAM@HEALTH  CARE FACL (ICD-V70.0)  Orders: EKG w/ Interpretation (93000)  Problem # 2:  ACUTE BRONCHITIS (ICD-466.0)  Her updated medication list for this problem includes:    Singulair 10 Mg Tabs (Montelukast sodium) .Marland Kitchen... 1 by mouth once daily as needed    Azithromycin 250 Mg Tabs (Azithromycin) .Marland Kitchen... As per pack  Problem # 3:  ANEMIA (ICD-285.9)  The following medications were removed from the medication list:    Folic Acid 800 Mcg Tabs (Folic acid) .Marland Kitchen... Couple of times weekly  Orders: Venipuncture ([address]) TLB-CBC Platelet - w/Differential (85025-CBCD) TLB-B12 + Folate Pnl (60454_09811-B14/NWG) TLB-IBC Pnl (Iron/FE;Transferrin) (83550-IBC)  Problem # 4:  VITAMIN D DEFICIENCY (ICD-268.9) corrected  Problem # 5:  ARTHRALGIA (ICD-719.40) DJD  Problem # 6:  HYPERLIPIDEMIA (ICD-272.4) LDL @ goal  Problem # 7:  GERD (ICD-530.81)  The following medications were removed from the medication list:    Prevacid 30 Mg Cpdr (Lansoprazole) .Marland Kitchen... 1 by mouth once daily (patient will take this or nexium) Her updated medication list for this problem includes:    Nexium 20 Mg  Cpdr (Esomeprazole magnesium) .Marland Kitchen... 1 by mouth two times a day  Problem # 8:  HYPOTHYROIDISM (ICD-244.9)  Her updated medication list for this problem includes:    Synthroid 112 Mcg Tabs (Levothyroxine sodium) .Marland Kitchen... 1 by mouth once daily except 1/2  t & th  Complete Medication List: 1)  Synthroid 112 Mcg Tabs (Levothyroxine sodium) .Marland Kitchen.. 1 by mouth once daily except 1/2  t & th 2)  Vitamin D3 1000 Unit Caps (Cholecalciferol) .... 2 by mouth once daily 3)  Centrum Tabs (Multiple vitamins-minerals) .Marland Kitchen.. 1 by mouth once daily 4)  Aloe Vera 470 Mg  Caps (Aloe vera) .Marland Kitchen.. 1 by mouth once daily 5)  Acidophilus Caps (Lactobacillus) .Marland Kitchen.. 1 by mouth once daily 6)  Aspirin 81 Mg Tabs (Aspirin) .Marland Kitchen.. 1 by mouth a couple x weekly 7)  Simvastatin 40 Mg Tabs (simvastatin)  .Marland Kitchen.. 1 at bedtime 8)  Nexium 20 Mg Cpdr (Esomeprazole magnesium) .Marland Kitchen.. 1 by mouth two times a day 9)  Furosemide 20 Mg Tabs (Furosemide) .Marland Kitchen.. 1 by mouth once daily as needed 10)  Fish Oil 1000 Mg Caps (Omega-3 fatty acids) .Marland Kitchen.. 1 by mouth once daily 11)  Singulair 10 Mg Tabs (Montelukast sodium) .Marland Kitchen.. 1 by mouth once daily as needed 12)  Azithromycin 250 Mg Tabs (Azithromycin) .... As per pack  Lipid Assessment/Plan:      Based on NCEP/ATP III, the patient's risk factor category is "2 or more risk factors and a calculated 10 year CAD risk of < 20%".  The patient's lipid goals are as follows: Total cholesterol goal is 200; LDL cholesterol goal is 130; HDL cholesterol goal is 40; Triglyceride goal is 150.    Patient Instructions: 1)   See Dr Charlett Blake for knee pain.Avoid foods high in acid (tomatoes, citrus juices, spicy foods). Avoid eating within two hours of lying down or before exercising. Do not over eat; try smaller more frequent meals. Elevate head of bed twelve inches when sleeping. Prescriptions: FUROSEMIDE 20 MG TABS (FUROSEMIDE) 1 by mouth once daily as needed  #90 x 3   Entered and Authorized by:   Marga Melnick MD   Signed by:   Marga Melnick MD on 03/04/2010   Method used:   [facility]ed to ...       [facility] [phone]* (retail)       [address]       [place], [place]  [address]       Ph: [phone]       Fax: [phone]   RxID:   [id number] SINGULAIR 10 MG TABS (MONTELUKAST SODIUM) 1 by mouth once daily as needed  #90 x 3   Entered and Authorized by:   Marga Melnick MD   Signed by:   Marga Melnick MD on 03/04/2010   Method used:   Faxed to ...       [facility] [phone]* (retail)       [address]        [place], [place]  [address]       Ph: [phone]       Fax: [phone]   RxID:   [id number] SIMVASTATIN 40 MG TABS (SIMVASTATIN) 1 at bedtime  #90 x 3   Entered and Authorized by:   Marga Melnick MD   Signed by:   Marga Melnick MD on 03/04/2010   Method used:   Faxed to .Marland KitchenMarland Kitchen  [facility] [address] [phone]* (retail)       [address]       [place], [place]  [address]       Ph: [phone]       Fax: [phone]   RxID:   [id number] AZITHROMYCIN 250 MG TABS (AZITHROMYCIN) as per pack  #1 x 0   Entered and Authorized by:   Marga Melnick MD   Signed by:   Marga Melnick MD on 03/04/2010   Method used:   Faxed to ...       [facility] [phone]* (retail)       [address]       [place], [place]  [address]       Ph: [phone]       Fax: [phone]   RxID:   [id number] SYNTHROID 112 MCG TABS (LEVOTHYROXINE SODIUM) 1 by mouth once daily EXCEPT 1/2  T & Th  #90 x 0   Entered and Authorized by:   Marga Melnick MD   Signed by:   Marga Melnick MD on 03/04/2010   Method used:   Faxed to ...       [facility] [phone]* (retail)       [address]       [place], [place]  [address]       Ph: [phone]       Fax: [phone]   RxID:   [id number]    Orders Added: 1)  Est. Patient 40-64 years [99396] 2)  EKG w/ Interpretation [93000] 3)  Venipuncture [62694] 4)  TLB-CBC Platelet - w/Differential [85025-CBCD] 5)  TLB-B12 + Folate Pnl [82746_82607-B12/FOL] 6)  TLB-IBC Pnl (Iron/FE;Transferrin) [83550-IBC]  Appended Document: CPX//PH

## 2010-04-27 NOTE — Letter (Signed)
Summary: Revised Verification of Disability Form/[employer]  Revised Verification of Disability Form/[employer]   Imported By: Lanelle Bal 01/12/2010 15:43:42  _____________________________________________________________________  External Attachment:    Type:   Image     Comment:   External Document

## 2010-04-27 NOTE — Progress Notes (Signed)
Summary: Lab Concerns  Phone Note Call from Patient Call back at Work Phone [phone]   Caller: Patient Summary of Call: Message left on VM: Patient said she never received labs and would like to know if   1.) Spoke with patient: Patient with concerns of swelling in feet off and on. Patient  is never really on her feet for long periods, patient would like to know if any lab values would be an indication for the swelling or what should she do about swelling.  2.) Patient said which tyroid level is responsible for weight. Patients weight is up and down between 3-5 pounds   I recommended OV for patient-Refused./Chrae [facility]  Aug 06, 2009 2:00 PM   Follow-up for Phone Call        Thyroid function tests are normal on present dose; no change indicated. Restrict salt ; use the salt substitute "No Salt" @ the table. Furosemide 20 mg once daily as needed edema# 30, RX1. Please consider GI followup as recommended. Follow-up by: Marga Melnick MD,  Aug 06, 2009 6:01 PM  Additional Follow-up for Phone Call Additional follow up Details #1::        Left message on machine for patient to return call when avaliable, Reason for call:    Discuss Dr.Hopper's recommendations Additional Follow-up by: Shonna Chock,  Aug 07, 2009 8:48 AM    Additional Follow-up for Phone Call Additional follow up Details #2::    Spoke with patient, all information ok'd patient indicated that she will f/u with GI Follow-up by: Shonna Chock,  Aug 07, 2009 3:54 PM  New/Updated Medications: FUROSEMIDE 20 MG TABS (FUROSEMIDE) 1 by mouth once daily as needed Prescriptions: FUROSEMIDE 20 MG TABS (FUROSEMIDE) 1 by mouth once daily as needed  #30 x 1   Entered by:   Shonna Chock   Authorized by:   Marga Melnick MD   Signed by:   Shonna Chock on 08/07/2009   Method used:   Electronically to        [facility] [phone]* (retail)       [address]       [place], [place]  [address]  Ph: [phone]       Fax: [phone]   RxID:   [id number]

## 2010-04-27 NOTE — Progress Notes (Signed)
Summary: Lab Results  Phone Note Outgoing Call Call back at Work Phone [phone]   Call placed by: Shonna Chock,  December 16, 2008 10:27 AM Call placed to: Patient Summary of Call: Left message on machine for patient to return call when avaliable, Reason for call:   This LDL of 135 has > 20% increased risk; minimal goal = < 10% risk ,ideal = < 5%.. Your LDL MINIMAL goal = < 90. Please take low dose  Pravastatin 20 mg& recheck fasting labs after 10 weeks (Lipids, hepatic panel; 272.4,995.20). TSH is too low ; decrease Synthroid to 1 once daily EXCEPT 1/2 on Weds . Check TSH in 10 weeks also(244.9) See me 2-3 days after labs drawn please.  Chrae Malloy  December 16, 2008 10:27 AM   Follow-up for Phone Call        Spoke with patient, RX sent to [facility], Patient will call back to schedule appointment to recheck in 10 weeks. Patient aware report mailed./Chrae [facility]  December 16, 2008 11:24 AM

## 2010-04-27 NOTE — Progress Notes (Signed)
Summary: Refill request-dose changed  Phone Note Refill Request Call back at Home Phone [phone] Message from:  Patient  Refills Requested: Medication #1:  SIMVASTATIN 40 MG TABS (SIMVASTATIN) 1 at bedtime  Method Requested: Fax to Local Pharmacy Initial call taken by: Shonna Chock,  Aug 13, 2009 11:21 AM    Prescriptions: SIMVASTATIN 40 MG TABS (SIMVASTATIN) 1 at bedtime  #90 x 1   Entered by:   Shonna Chock   Authorized by:   Marga Melnick MD   Signed by:   Shonna Chock on 08/13/2009   Method used:   Faxed to ...       [facility] [phone]* (retail)       [address]       [place], [place]  [address]       Ph: [phone]       Fax: [phone]   RxID:   [id number]

## 2010-04-27 NOTE — Letter (Signed)
Summary: Results Follow up Letter  [facility] [place]  [address] [place], [place] [address]   Phone: [phone]  Fax: [phone]    12/16/2008 MRN: [id number]  Kristin Banks [address] [place], [place]  [address]  Dear Ms. Kristin Banks,  The following are the results of your recent test(s):  Test         Result    Pap Smear:        Normal _____  Not Normal _____ Comments: ______________________________________________________ Cholesterol: LDL(Bad cholesterol):         Your goal is less than:         HDL (Good cholesterol):       Your goal is more than: Comments:  ______________________________________________________ Mammogram:        Normal _____  Not Normal _____ Comments:  ___________________________________________________________________ Hemoccult:        Normal _____  Not normal _______ Comments:    _____________________________________________________________________ Other Tests: Please see attached labs done on 12/12/2008    We routinely do not discuss normal results over the telephone.  If you desire a copy of the results, or you have any questions about this information we can discuss them at your next office visit.   Sincerely,

## 2010-04-27 NOTE — Letter (Signed)
Summary: New Patient Letter  [facility] [place]  [address] [place], [place] [address]   Phone: [phone]  Fax: [phone]       11/07/2008 MRN: [id number]  Kristin Banks [address] [place], [place]  [address]  Dear [name],   Welcome to [facility] and thank you for choosing [place] as your Primary Care Providers. Enclosed you will find information about our practice that we hope you find helpful. We have also enclosed forms to be filled out prior to your visit. This will provide [place] with the necessary information and facilitate your being seen in a timely manner. If you have any questions, please call us at:  [phone]        and we will be happy to assist you. We look forward to seeing you at your scheduled appointment time.  Appointment   AUGUST 31,2010 AT 3:30PM            with Dr.  Alwyn Ren               Sincerely,  Primary Health Care Team  Please arrive 15 minutes early for your first appointment and bring your insurance card. Co-pay is required at the time of your visit.  *****Please call the office if you are not able to keep this appointment. There is a charge of $50.00 if any appointment is not cancelled or rescheduled within 24 hours.

## 2010-04-27 NOTE — Progress Notes (Signed)
----   Converted from flag ---- ---- 10/16/2009 1:13 PM, Okey Regal [place] wrote: labs added to order .   ---- 10/16/2009 11:45 AM, Shonna Chock CMA wrote: TLB-Lipid Panel (80061-LIPID) TLB-BMP (Basic Metabolic Panel-BMET) (80048-METABOL) TLB-CBC Platelet - w/Differential (85025-CBCD) TLB-Hepatic/Liver Function Pnl (80076-HEPATIC) TLB-TSH (Thyroid Stimulating Hormone) (84443-TSH) T-Vitamin D (25-Hydroxy) ([address]) Stool Cards v70.0/268.9/244.9/272.4  for september 2011 ------------------------------

## 2010-04-27 NOTE — Progress Notes (Signed)
Summary: Gym membership concerns  Phone Note Call from Patient Call back at [facility] Phone [phone]   Caller: Patient Summary of Call: Patient called to say the [employer] is still billing her and will not let her get rid of membership due to more information to be provided.  I called [facility] [employer] at [phone] and spoke with [facility] and she indicated there needs to be an end date ie: 6 months or 1 year, per Dr.Bain Whichard please advise.  Form was printed and placed on ledge./Chrae [facility] CMA  January 04, 2010 4:31 PM   Follow-up for Phone Call        Dr.Delina Kruczek filled out paperwork and indicated 4-6 months and we refaxed Follow-up by: Shonna Chock CMA,  January 04, 2010 5:02 PM

## 2010-04-27 NOTE — Progress Notes (Signed)
Summary: COLD  Phone Note Call from Patient Call back at Home Phone [phone]   Caller: Patient Call For: Marga Melnick MD Summary of Call: PT CAME IN FOR LABS AND WOULD LIKE SOMETHING CALL IN FOR A COLD-MOCUS-LIGHT GREEN--[facility] Initial call taken by: Freddy Jaksch,  February 25, 2010 2:56 PM  Follow-up for Phone Call        Patient was last seen 8/11, do you want to send something or recommend office visit? Lucious Groves CMA  February 25, 2010 3:22 PM     Additional Follow-up for Phone Call Additional follow up Details #1::        Per MD--She can use Neti pot and Airborne. Patient advised and doesn't want a nasal rinse or cleanser, she wants a decongestant. I advised patient the she could try Mucinex, office visit needed for ABX. Patient states that she has an office visit and will continue with Robitussin then disconnected the call. Lucious Groves CMA  February 25, 2010 4:06 PM

## 2010-04-27 NOTE — Letter (Signed)
Summary: Primary Care Consult Scheduled Letter  [facility] [place]  [address] [place], [place] [address]   Phone: [phone]  Fax: [phone]      07/10/2009 MRN: [id number]  Kristin Banks [address] [place], [place]  [address]    Dear Ms. Kristin Banks,    We have scheduled an appointment for you.  At the recommendation of Dr. Marga Melnick, we have scheduled you for a Bone Density Scan with [facility] on 07-27-2009 at 9:00am.  Their address is [address], [place], [place] [address]. The office phone number is [phone].  If this appointment day and time is not convenient for you, please feel free to call the office of the doctor you are being referred to at the number listed above and reschedule the appointment.    It is important for you to keep your scheduled appointments. We are here to make sure you are given good patient care.   Thank you,    Renee, Patient Care Coordinator  at [facility]

## 2010-04-27 NOTE — Assessment & Plan Note (Signed)
Summary: NEW PT CPX/UHC/NS/KDC   Vital Signs:  Patient profile:   65 year old female Height:      62.75 inches Weight:      232.8 pounds BMI:     41.72 Temp:     98.0 degrees F oral Pulse rate:   64 / minute Resp:     16 per minute BP sitting:   122 / 74  (left arm) Cuff size:   large  Vitals Entered By: Shonna Chock (December 12, 2008 11:00 AM)  Comments REVIEWED MED LIST, PATIENT AGREED DOSE AND INSTRUCTION CORRECT    History of Present Illness: Kristin Banks is here for new patient CPX; she has had a lesion on [place] of liver [phone] which her MD in [place] has monitored.  Preventive Screening-Counseling & Management  Alcohol-Tobacco     Smoking Status: never  Caffeine-Diet-Exercise     Does Patient Exercise: yes  Allergies (verified): No Known Drug Allergies  Past History:  Past Medical History: Hypothyroidism GERD/ Barrett's Esophagus; PMH of H pylori gastritis ,treated 2008, Dr Jodi Marble, [facility] Allergic rhinitis Colonic polyps, hx of X 4 since 1989, adenomatous & hyperplastic Uterine Fibroids, DrAnderson Hyperlipidemia: LDL goal = < 90 based on NMR Lipoprofile 03/06/2008( LDL 138 with 2162 total & 0454 small dense particles for approx 21 % risk) Cholelithiasis; Simple hepatic cysts  as per [place] 01/2006 Vitamoin D def (28.8 on 03/06/2008)  Past Surgical History: Endoscopy X3 for Barrett's Colon polypectomy Arthroscopy for meniscal tear & cyst 2007, Dr Marina Gravel; now seeing Dr Charlett Blake  Family History: Father: deceased, bone cancer Mother: deceased, lung cancer/thyroid cancer/hyperthyroidism, MGM: arthritis, cervical CA Siblings: 1 living sister, 1 deceased sister ovarian cancer, metastatic to colon  Social History: Occupation: Production designer, theatre/television/film Divorced Never Smoked Alcohol use-yes: RARE Regular exercise-yes: gym 1-3X/ week Smoking Status:  never Does Patient Exercise:  yes  Review of Systems General:  Complains of sleep disorder and sweats; denies fatigue; Irregular  sleep pattern. Weight down 15#/ past year  with diet & CVE . Eyes:  Denies blurring, double vision, and vision loss-both eyes. ENT:  Denies difficulty swallowing and hoarseness. CV:  Denies chest pain or discomfort, leg cramps with exertion, palpitations, and shortness of breath with exertion. Resp:  Complains of cough and sputum productive; denies excessive snoring, hypersomnolence, morning headaches, shortness of breath, and wheezing; Productive cough X 2 weeks; Rx: Mucinex & Robitussin. GI:  Denies bloody stools, constipation, dark tarry stools, diarrhea, and indigestion; Dr Elbert Ewings , GI in [place] has monitored H pylori. Occa mid abd pain. No dysphagia. GU:  Denies discharge, dysuria, and hematuria; Dr Dareen Piano seen 11/26/2008. MS:  Complains of joint pain; denies joint redness and joint swelling; Variable pain R index finger since fall in 05/2008. Derm:  Complains of changes in nail beds; denies dryness, hair loss, and lesion(s); Nails soft; requesting removal of skin tags @ neck (I explained "cosmetic" nature & ins coverage  issues). Neuro:   Intermittent burning sensation RUE  since fall in 05/2008. Psych:  Denies anxiety and depression. Endo:  Complains of cold intolerance; denies excessive hunger, excessive thirst, excessive urination, and heat intolerance. Heme:  Denies abnormal bruising and bleeding. Allergy:  Complains of itching eyes and seasonal allergies; denies sneezing.  Physical Exam  General:  well-nourished,in no acute distress; alert,appropriate and cooperative throughout examination Head:  Normocephalic and atraumatic without obvious abnormalities. Eyes:  No corneal or conjunctival inflammation noted. Perrla. Funduscopic exam benign, without hemorrhages, exudates or papilledema.  Ears:  External ear  exam shows no significant lesions or deformities.  Otoscopic examination reveals clear canals, tympanic membranes are intact bilaterally without bulging, retraction,  inflammation or discharge. Hearing is grossly normal bilaterally. Nose:  External nasal examination shows no deformity or inflammation. Nasal mucosa are pink and moist without lesions or exudates. Mouth:  Oral mucosa and oropharynx without lesions or exudates.  Teeth in good repair. Neck:  No deformities, masses, or tenderness noted. Lungs:  Normal respiratory effort, chest expands symmetrically. Lungs are clear to auscultation, no crackles or wheezes. Heart:  Normal rate and regular rhythm. S1 and S2 normal without gallop, murmur, click, rub. S4 with slurring Abdomen:  Bowel sounds positive,abdomen soft and non-tender without masses, organomegaly or hernias noted. Genitalia:  Dr Dareen Piano Msk:  No deformity or scoliosis noted of thoracic or lumbar spine.   Pulses:  R and L carotid,radial,dorsalis pedis and posterior tibial pulses are full and equal bilaterally Extremities:  No clubbing, cyanosis, edema, or deformity noted with normal full range of motion of all joints.  Crepitus knees  Neurologic:  alert & oriented X3 and DTRs symmetrical and normal.   Skin:  Venous spiders lower thighs; skin tags of neck Cervical Nodes:  No lymphadenopathy noted Axillary Nodes:  No palpable lymphadenopathy Psych:  memory intact for recent and remote, normally interactive, and good eye contact.     Impression & Recommendations:  Problem # 1:  ROUTINE GENERAL MEDICAL EXAM@HEALTH  CARE FACL (ICD-V70.0)  Orders: EKG w/ Interpretation (93000) Venipuncture (16109) TLB-Lipid Panel (80061-LIPID) TLB-BMP (Basic Metabolic Panel-BMET) (80048-METABOL) TLB-CBC Platelet - w/Differential (85025-CBCD) TLB-Hepatic/Liver Function Pnl (80076-HEPATIC) TLB-TSH (Thyroid Stimulating Hormone) (84443-TSH) T-Vitamin D (25-Hydroxy) (60454-09811) Gastroenterology Referral (GI)  Problem # 2:  BRONCHITIS-ACUTE (ICD-466.0)  Problem # 3:  BARRETTS ESOPHAGUS (ICD-530.85)  Orders: Gastroenterology Referral (GI)  Problem # 4:   COLONIC POLYPS, HX OF (ICD-V12.72)  Orders: Gastroenterology Referral (GI)  Problem # 5:  HYPERLIPIDEMIA (ICD-272.4)  LDL goal < 80  Orders: EKG w/ Interpretation (93000) Venipuncture (91478) TLB-Lipid Panel (80061-LIPID)  Problem # 6:  CHOLELITHIASIS (ICD-574.20) simple hepatic cysts on [place] 2007  Problem # 7:  VITAMIN D DEFICIENCY (ICD-268.9)  Orders: Venipuncture (29562) T-Vitamin D (25-Hydroxy) (13086-57846)  Problem # 8:  HYPOTHYROIDISM (ICD-244.9)  Her updated medication list for this problem includes:    Synthroid 112 Mcg Tabs (Levothyroxine sodium) .Marland Kitchen... 1 by mouth once daily  Orders: Venipuncture (96295) TLB-TSH (Thyroid Stimulating Hormone) (84443-TSH)  Complete Medication List: 1)  Synthroid 112 Mcg Tabs (Levothyroxine sodium) .Marland Kitchen.. 1 by mouth once daily 2)  Nexium 40 Mg Pack (Esomeprazole magnesium) .Marland Kitchen.. 1 by mouth once daily 3)  Prevacid 30 Mg Cpdr (Lansoprazole) .Marland Kitchen.. 1 by mouth once daily (patient will take this or nexium) 4)  Vitamin D3 1000 Unit Caps (Cholecalciferol) .Marland Kitchen.. 1 by mouth once daily 5)  Centrum Tabs (Multiple vitamins-minerals) .Marland Kitchen.. 1 by mouth once daily 6)  Aloe Vera 470 Mg Caps (Aloe vera) .Marland Kitchen.. 1 by mouth once daily 7)  Acidophilus Caps (Lactobacillus) .Marland Kitchen.. 1 by mouth once daily 8)  Folic Acid 800 Mcg Tabs (Folic acid) .... Couple of times weekly 9)  Biotin 300 Mcg Tabs (Biotin) .... Couple of times weekly 10)  Aspirin 81 Mg Tabs (Aspirin) .Marland Kitchen.. 1 by mouth a couple x weekly  Patient Instructions: 1)  Your LDL goal = < 90 based on NMR Lipoprofile done 02/2008.

## 2010-04-27 NOTE — Progress Notes (Signed)
Summary: Lab Results  Phone Note Outgoing Call   Call placed by: Shonna Chock CMA,  October 19, 2009 4:09 PM Summary of Call: Left message on machine for patient to return call when avaliable, Reason for call:  Muscle enzyme is normal. Sed rate measures inflammation; it is mildly elevated. Please see me to assess this  findings in context of your symtoms. Hopp  Rheumatoid Arthritis test is negative. Vitamin D level is below goal of 40-60. Add 1000 International Units vit D3 to present daily dose of vit D. Monitor vit D level annually. Levester Fresh CMA  October 19, 2009 4:10 PM   Follow-up for Phone Call        Spoke with patient, patient ok'd all information and will futher discuss with Dr.Hopper at ther pending appointment Follow-up by: Shonna Chock CMA,  October 20, 2009 10:28 AM

## 2010-04-27 NOTE — Assessment & Plan Note (Signed)
Summary: review lab/cbs   Vital Signs:  Patient profile:   65 year old female Weight:      232.2 pounds BMI:     41.61 Pulse rate:   76 / minute Resp:     15 per minute BP sitting:   110 / 72  (left arm) Cuff size:   large  Vitals Entered By: Shonna Chock CMA (October 21, 2009 8:13 AM) CC: Follow-up visit: Discuss Labs, Discuss paper to d/c gym membership, Lower Extremity Joint pain   CC:  Follow-up visit: Discuss Labs, Discuss paper to d/c gym membership, and Lower Extremity Joint pain.  History of Present Illness:  Lower Extremity Joint Pain      This is a 65 year old woman who presents with Lower Extremity Joint pain in knees.  The patient reports giving away, popping  to R  , stiffness for >1 hr, and weakness, but denies swelling, redness, locking, and decreased ROM.  The pain is located in both  the left knee and right knee; pain is greater in R knee.  The pain began gradually and with no injury.  The pain is described as aching and intermittent.  Exercise @ the gym [facility]) has exacerbated these symptoms.Evaluation to date has included plain X-rays, MRI scan, and Orthopedic consult by Dr Charlett Blake in 2010. NSAIDS were Rxed by her. 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 Katrinka Blazing in [place]. PMH of 5-6 steroid injections R knee. The patient denies the following symptoms: fever, rash, photosensitivity, eye symptoms, diarrhea, and dysuria. She has a history of Barrett's ; her last Endoscopy was in 2009 in [place] by Dr Jodi Marble.   Current Medications (verified): 1)  Synthroid 112 Mcg Tabs (Levothyroxine Sodium) .Marland Kitchen.. 1 By Mouth Once Daily Except 1/2 On Weds 2)  Prevacid 30 Mg Cpdr (Lansoprazole) .Marland Kitchen.. 1 By Mouth Once Daily (Patient Will Take This or Nexium) 3)  Vitamin D3 1000 Unit Caps (Cholecalciferol) .Marland Kitchen.. 1 By Mouth Once Daily 4)  Centrum  Tabs (Multiple Vitamins-Minerals) .Marland Kitchen.. 1 By Mouth Once Daily 5)  Aloe Vera 470 Mg Caps (Aloe Vera) .Marland Kitchen.. 1  By Mouth Once Daily 6)  Acidophilus  Caps (Lactobacillus) .Marland Kitchen.. 1 By Mouth Once Daily 7)  Folic Acid 800 Mcg Tabs (Folic Acid) .... Couple of Times Weekly 8)  Biotin 300 Mcg Tabs (Biotin) .... Couple of Times Weekly 9)  Aspirin 81 Mg Tabs (Aspirin) .Marland Kitchen.. 1 By Mouth A Couple X Weekly 10)  Simvastatin 40 Mg Tabs (Simvastatin) .Marland Kitchen.. 1 At Bedtime 11)  Dexilant 60 Mg Cpdr (Dexlansoprazole) .Marland Kitchen.. 1 Once Daily 12)  Allegra 180 Mg Tabs (Fexofenadine Hcl) .Marland Kitchen.. 1 By Mouth Once Daily Otc 13)  Furosemide 20 Mg Tabs (Furosemide) .Marland Kitchen.. 1 By Mouth Once Daily As Needed  Allergies (verified): No Known Drug Allergies  Past History:  Past Medical History: Hypothyroidism GERD/ Barrett's Esophagus; PMH of H pylori gastritis ,treated 2008, Dr Jodi Marble, [facility] Allergic rhinitis Colonic polyps, hx of X 4 since 1989, adenomatous & hyperplastic Uterine Fibroids, DrAnderson Hyperlipidemia: LDL goal = < 90(ideally < 70) based on NMR Lipoprofile 03/06/2008( LDL 138 with 2162 total & 1610 small dense particles for approx 21 % risk)TG 122, HDL 41 Cholelithiasis; Simple hepatic cysts  as per [place] 01/2006 Vitamin D def (28.8 on 03/06/2008)  Review of Systems General:  Complains of sweats; denies chills and weight loss. Eyes:  Denies discharge, eye pain, red eye, and vision loss-both eyes. ENT:  Denies difficulty swallowing and hoarseness. GI:  Denies abdominal pain, bloody stools, dark tarry stools, and indigestion; No dysphagia. GU:  Denies discharge and hematuria. Neuro:  Complains of numbness; denies tingling; Occasional numbness in toes of L foot.  Physical Exam  General:  in no acute distress; alert,appropriate and cooperative throughout examination Eyes:  No corneal or conjunctival inflammation noted.Slight ptosis OS. No icterus Mouth:  Oral mucosa and oropharynx without lesions or exudates. No pharyngeal erythema.  pharyngeal erythema.   Lungs:  Normal respiratory effort, chest expands symmetrically. Lungs are  clear to auscultation, no crackles or wheezes. Heart:  Normal rate and regular rhythm. S1 and S2 normal without gallop, murmur, click, rub or other extra sounds. Abdomen:  Bowel sounds positive,abdomen soft and non-tender without masses, organomegaly or hernias noted. Pulses:  R and L carotid,radial,dorsalis pedis and posterior tibial pulses are full and equal bilaterally Extremities:  No clubbing, cyanosis, edema. Crepitus R knee > L Neurologic:  alert & oriented X3.  alert & oriented X3.   Skin:  Intact without suspicious lesions or rashes Psych:  memory intact for recent and remote, normally interactive, and good eye contact.  memory intact for recent and remote, normally interactive, and good eye contact.     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 1 by mouth once daily except 1/2 on weds  Her updated medication list for this problem includes:    Synthroid 112 Mcg Tabs (Levothyroxine sodium) .Marland Kitchen... 1 by mouth once daily except 1/2 on weds  Orders: Venipuncture (44315) TLB-TSH (Thyroid Stimulating Hormone) (84443-TSH)  Problem # 6:  COLONIC POLYPS, HX OF (ICD-V12.72)  Problem # 7:  ANEMIA (ICD-285.9)  Her updated medication list for this problem includes:    Folic Acid 800 Mcg Tabs (Folic acid) .Marland Kitchen... Couple of times weekly  Orders: Venipuncture (40086) TLB-CBC Platelet - w/Differential (85025-CBCD) TLB-B12 + Folate Pnl  (76195_09326-Z12/WPY) TLB-IBC Pnl (Iron/FE;Transferrin) (83550-IBC) Specimen Handling (09983) Gastroenterology Referral (GI)  Her updated medication list for this problem includes:    Folic Acid 800 Mcg Tabs (Folic acid) .Marland Kitchen... Couple of times weekly  Complete Medication List: 1)  Synthroid 112 Mcg Tabs (Levothyroxine sodium) .Marland Kitchen.. 1 by mouth once daily except 1/2 on weds 2)  Prevacid 30 Mg Cpdr (Lansoprazole) .Marland Kitchen.. 1 by mouth once daily (patient will take this or nexium) 3)  Vitamin D3 1000 Unit Caps (Cholecalciferol) .... 3 by mouth once daily 4)  Centrum Tabs (Multiple vitamins-minerals) .Marland Kitchen.. 1 by mouth once daily 5)  Aloe Vera 470 Mg Caps (Aloe vera) .Marland Kitchen.. 1 by mouth once daily 6)  Acidophilus Caps (Lactobacillus) .Marland Kitchen.. 1 by mouth once daily 7)  Folic Acid 800 Mcg Tabs (Folic acid) .... Couple of times weekly 8)  Biotin 300 Mcg Tabs (Biotin) .... Couple of times weekly 9)  Aspirin 81 Mg Tabs (Aspirin) .Marland Kitchen.. 1 by mouth a couple x weekly 10)  Simvastatin 40 Mg Tabs (simvastatin)  .Marland Kitchen.. 1 at bedtime 11)  Dexilant 60 Mg Cpdr (Dexlansoprazole) .Marland Kitchen.. 1 once daily 12)  Allegra 180 Mg  Tabs (Fexofenadine hcl) .Marland Kitchen.. 1 by mouth once daily otc 13)  Furosemide 20 Mg Tabs (Furosemide) .Marland Kitchen.. 1 by mouth once daily as needed 14)  Tramadol Hcl 50 Mg Tabs (Tramadol hcl) .... 1/2 -1 every 6 hrs as needed for joint pain  Patient Instructions: 1)  Consume LESS THAN 30 grams of sugar/day from High Fructose Corn Syrup  as discussed.Check vit D level in 4 months. Complete stool cards. See Dr Charlett Blake if knees do not improve. Prescriptions: TRAMADOL HCL 50 MG TABS (TRAMADOL HCL) 1/2 -1 every 6 hrs as needed for joint pain  #30 x 1   Entered and Authorized by:   Marga Melnick MD   Signed by:   Marga Melnick MD on 10/21/2009   Method used:   Print then Give to Patient   RxID:   [id number]

## 2010-04-27 NOTE — Progress Notes (Signed)
Summary: NOTE ABOUT NEXIUM VERSUS DEXILANT  Phone Note Call from Patient   Caller: Patient Summary of Call: PATIENT WANTS TO LET DR HOPPER KNOW THAT [facility] AUTOMATICALLY ORDERED THE DEXILANT FOR HER---SHE SAYS, SINCE SHE HAS MET HER DEDUCTIBLE, SHE CAN NOW AFFORD THE NEXIUM  SAYS TO ASK THAT WE ONLY REFILL MEDICATION REQUESTS THAT SHE CALLS IN HERSELF---SHE HAS ASKED [facility] TO TAKE HER OFF OF THE AUTOMATIC REFILL Initial call taken by: Jerolyn Shin,  December 07, 2009 10:22 AM  Follow-up for Phone Call        Noted./[facility] CMA  December 07, 2009 11:42 AM

## 2010-04-27 NOTE — Progress Notes (Signed)
Summary: NEEDS LAB CLARIFICATION  Phone Note Call from Patient Call back at Work Phone [phone]   Caller: Patient Summary of Call: PLEASE CALL HER ----SHE NEEDS CLARIFICATION ON LAST SET OF LAB RESULTS THAT SHE RECEIVED Initial call taken by: Jerolyn Shin,  October 27, 2009 12:34 PM  Follow-up for Phone Call        Spoke with patient, patient indicated she needs to have a refill on Chlosterol meds and she questioned her WBC being slightly off, I informed patient it is of no concern or Doctor Alwyn Ren would of addressed in detail, patient ok'd. Follow-up by: Shonna Chock CMA,  October 27, 2009 1:56 PM    Prescriptions: SIMVASTATIN 40 MG TABS (SIMVASTATIN) 1 at bedtime  #90 x 1   Entered by:   Shonna Chock CMA   Authorized by:   Marga Melnick MD   Signed by:   Shonna Chock CMA on 10/27/2009   Method used:   [facility]ed to ...       [phone][facility] (retail)       [address]       [place], [place]  [address]       Ph: [phone]       Fax: [phone]   RxID:   [id number]

## 2010-04-27 NOTE — Assessment & Plan Note (Signed)
Summary: RASH ON RIGHT SIDE ABOVE WAIST//KN   Vital Signs:  Patient profile:   65 year old female Weight:      234.4 pounds Temp:     98.3 degrees F oral Pulse rate:   72 / minute Resp:     15 per minute BP sitting:   116 / 68  (left arm) Cuff size:   large  Vitals Entered By: Shonna Chock CMA (November 03, 2009 10:38 AM) CC: Rash on the right side    CC:  Rash on the right side .  History of Present Illness: She noted minor itching @ R flank 10/31/2009 after showering ;linear lesion with ? blisters noted. She denies any injury or plant exposure. Rx: lotion, Desonide, alcohol. The lesion is drying & less itchy.Her concern was Shingles.  Allergies (verified): No Known Drug Allergies  Past History:  Past Medical History: Hypothyroidism GERD/ Barrett's Esophagus; PMH of H pylori gastritis ,treated 2008, Dr Jodi Marble, [facility]( Past Rxs: Prevacid, Protonix,Zantac, Dexilant. Nexium & Prevacid most effective to date) Allergic rhinitis Colonic polyps, hx of X 4 since 1989, adenomatous & hyperplastic Uterine Fibroids, DrAnderson Hyperlipidemia: LDL goal = < 90(ideally < 70) based on NMR Lipoprofile 03/06/2008( LDL 138 with 2162 total & 2993 small dense particles for approx 21 % risk)TG 122, HDL 41 Cholelithiasis; Simple hepatic cysts  as per [place] 01/2006 Vitamin D def (28.8 on 03/06/2008)  Review of Systems General:  Denies chills, fever, and weight loss. ENT:  Denies difficulty swallowing and hoarseness. GI:  Denies abdominal pain, bloody stools, dark tarry stools, and indigestion; Dexilant is cheaper but not as effective as Nexium; once  deductible met Nexium taken. Stool dark on iron  self Rxed for borderline anemia. Dr Jodi Marble seen last week; he reviewed our records. She has been on PPI  two times a day . He plans Endoscopy for Barrett's survelliance.Marland Kitchen  Physical Exam  General:  in no acute distress; alert,appropriate and cooperative throughout examination Skin:  13.5 cm linear "scrath "  lesion with minimal papular changes. No Dermatographia Cervical Nodes:  No lymphadenopathy noted Axillary Nodes:  No palpable lymphadenopathy   Impression & Recommendations:  Problem # 1:  RASH-NONVESICULAR (ICD-782.1) Clinically  etiology  such as rose bush scratch suggested; not in zoster dermatome  Problem # 2:  GERD (ICD-530.81)  Her updated medication list for this problem includes:    Prevacid 30 Mg Cpdr (Lansoprazole) .Marland Kitchen... 1 by mouth once daily (patient will take this or nexium)    Nexium 20 Mg Cpdr (Esomeprazole magnesium) .Marland Kitchen... 1 by mouth two times a day  Complete Medication List: 1)  Synthroid 112 Mcg Tabs (Levothyroxine sodium) .Marland Kitchen.. 1 by mouth once daily except 1/2 on weds 2)  Prevacid 30 Mg Cpdr (Lansoprazole) .Marland Kitchen.. 1 by mouth once daily (patient will take this or nexium) 3)  Vitamin D3 1000 Unit Caps (Cholecalciferol) .... 3 by mouth once daily 4)  Centrum Tabs (Multiple vitamins-minerals) .Marland Kitchen.. 1 by mouth once daily 5)  Aloe Vera 470 Mg Caps (Aloe vera) .Marland Kitchen.. 1 by mouth once daily 6)  Acidophilus Caps (Lactobacillus) .Marland Kitchen.. 1 by mouth once daily 7)  Folic Acid 800 Mcg Tabs (Folic acid) .... Couple of times weekly 8)  Biotin 300 Mcg Tabs (Biotin) .... Couple of times weekly 9)  Aspirin 81 Mg Tabs (Aspirin) .Marland Kitchen.. 1 by mouth a couple x weekly 10)  Simvastatin 40 Mg Tabs (simvastatin)  .Marland Kitchen.. 1 at bedtime 11)  Nexium 20 Mg Cpdr (Esomeprazole magnesium) .Marland KitchenMarland KitchenMarland Kitchen 1  by mouth two times a day 12)  Allegra 180 Mg Tabs (Fexofenadine hcl) .Marland Kitchen.. 1 by mouth once daily otc 13)  Furosemide 20 Mg Tabs (Furosemide) .Marland Kitchen.. 1 by mouth once daily as needed 14)  Tramadol Hcl 50 Mg Tabs (Tramadol hcl) .... 1/2 -1 every 6 hrs as needed for joint pain 15)  Fish Oil 1000 Mg Caps (Omega-3 fatty acids) .Marland Kitchen.. 1 by mouth once daily  Patient Instructions: 1)  Use Desonide on rash once daily - two times a day as needed . Confirm whether Prevacid or Nexium is preferred cost wise

## 2010-04-29 NOTE — Letter (Signed)
Summary: Letter with Path Results/[facility]  Letter with Path Results/[facility]   Imported By: Lanelle Bal 03/10/2010 16:00:55  _____________________________________________________________________  External Attachment:    Type:   Image     Comment:   External Document

## 2010-04-29 NOTE — Assessment & Plan Note (Signed)
Summary: HEAVINESS IN HER STOMACH   Vital Signs:  Patient profile:   65 year old female Weight:      228 pounds BMI:     41.19 Temp:     98.6 degrees F oral Pulse rate:   64 / minute Resp:     16 per minute BP sitting:   114 / 72  (left arm) Cuff size:   large  Vitals Entered By: Shonna Chock CMA (March 19, 2010 3:49 PM) CC: Heaviness in lower abdomen, Abdominal pain   CC:  Heaviness in lower abdomen and Abdominal pain.  History of Present Illness: Abdominal Pain      This is a 65 year old woman who presents with Abdominal pain since 12/17.  The patient denies nausea, vomiting, diarrhea, constipation, melena, hematochezia, anorexia, and hematemesis.  The location of the pain is suprapubic.  The pain is described as constant and dull.  The patient denies the following symptoms: fever, weight loss, pyuria , hematuria, dysuria, dark urine, and vaginal bleeding.  The pain has no trigger or reliever.Diflucan 150 mg X1, Monistat & Azo seemed to help temporarily. last colonscopy was 2009 in [place], [place]. Negative Gyn exam 01/2010 by Dr Dareen Piano.  Current Medications (verified): 1)  Synthroid 112 Mcg Tabs (Levothyroxine Sodium) .Marland Kitchen.. 1 By Mouth Once Daily Except 1/2  T & Th 2)  Vitamin D3 1000 Unit Caps (Cholecalciferol) .... 2 By Mouth Once Daily 3)  Centrum  Tabs (Multiple Vitamins-Minerals) .Marland Kitchen.. 1 By Mouth Once Daily 4)  Aloe Vera 470 Mg Caps (Aloe Vera) .Marland Kitchen.. 1 By Mouth Once Daily 5)  Acidophilus  Caps (Lactobacillus) .Marland Kitchen.. 1 By Mouth Once Daily 6)  Aspirin 81 Mg Tabs (Aspirin) .Marland Kitchen.. 1 By Mouth A Couple X Weekly 7)  Simvastatin 40 Mg Tabs (Simvastatin) .Marland Kitchen.. 1 At Bedtime 8)  Nexium 20 Mg Cpdr (Esomeprazole Magnesium) .Marland Kitchen.. 1 By Mouth Two Times A Day 9)  Furosemide 20 Mg Tabs (Furosemide) .Marland Kitchen.. 1 By Mouth Once Daily As Needed 10)  Fish Oil 1000 Mg Caps (Omega-3 Fatty Acids) .Marland Kitchen.. 1 By Mouth Once Daily 11)  Singulair 10 Mg Tabs (Montelukast Sodium) .Marland Kitchen.. 1 By Mouth Once Daily As  Needed  Allergies (verified): No Known Drug Allergies  Physical Exam  General:  well-nourished,in no acute distress; alert,appropriate and cooperative throughout examination Eyes:  No corneal or conjunctival inflammation noted.No icterus  Mouth:  Oral mucosa and oropharynx without lesions or exudates.  No pharyngeal erythema.   Lungs:  Normal respiratory effort, chest expands symmetrically. Lungs are clear to auscultation, no crackles or wheezes. Heart:  Normal rate and regular rhythm. S1 and S2 normal without gallop, murmur, click, rub .S4 Abdomen:  Bowel sounds positive,abdomen soft  but tender  in suprapubic area without masses, organomegaly or hernias noted. Skin:  Intact without suspicious lesions or rashes. Warm & dry  Cervical Nodes:  No lymphadenopathy noted Axillary Nodes:  No palpable lymphadenopathy   Impression & Recommendations:  Problem # 1:  ABDOMINAL PAIN, SUPRAPUBIC (ICD-789.09)  Her updated medication list for this problem includes:    Aspirin 81 Mg Tabs (Aspirin) .Marland Kitchen... 1 by mouth a couple x weekly  Problem # 2:  MICROSCOPIC HEMATURIA (ICD-599.72) probable UTI The following medications were removed from the medication list:    Azithromycin 250 Mg Tabs (Azithromycin) .Marland Kitchen... As per pack Her updated medication list for this problem includes:    Nitrofurantoin Monohyd Macro 100 Mg Caps (Nitrofurantoin monohyd macro) .Marland Kitchen... 1 two times a day with 8 oz  water  Complete Medication List: 1)  Synthroid 112 Mcg Tabs (Levothyroxine sodium) .Marland Kitchen.. 1 by mouth once daily except 1/2  t & th 2)  Vitamin D3 1000 Unit Caps (Cholecalciferol) .... 2 by mouth once daily 3)  Centrum Tabs (Multiple vitamins-minerals) .Marland Kitchen.. 1 by mouth once daily 4)  Aloe Vera 470 Mg Caps (Aloe vera) .Marland Kitchen.. 1 by mouth once daily 5)  Acidophilus Caps (Lactobacillus) .Marland Kitchen.. 1 by mouth once daily 6)  Aspirin 81 Mg Tabs (Aspirin) .Marland Kitchen.. 1 by mouth a couple x weekly 7)  Simvastatin 40 Mg Tabs (simvastatin)  .Marland Kitchen.. 1 at  bedtime 8)  Nexium 20 Mg Cpdr (Esomeprazole magnesium) .Marland Kitchen.. 1 by mouth two times a day 9)  Furosemide 20 Mg Tabs (Furosemide) .Marland Kitchen.. 1 by mouth once daily as needed 10)  Fish Oil 1000 Mg Caps (Omega-3 fatty acids) .Marland Kitchen.. 1 by mouth once daily 11)  Singulair 10 Mg Tabs (Montelukast sodium) .Marland Kitchen.. 1 by mouth once daily as needed 12)  Nitrofurantoin Monohyd Macro 100 Mg Caps (Nitrofurantoin monohyd macro) .Marland Kitchen.. 1 two times a day with 8 oz water 13)  Phenazo 200 Mg Tabs (Phenazopyridine hcl) .Marland Kitchen.. 1 three times a day as needed  Other Orders: UA Dipstick w/o Micro (manual) (16109) Specimen Handling (99000) T-Culture, Urine ([id number])  Patient Instructions: 1)  Drink as much fluid as you can tolerate for the next few days. Prescriptions: PHENAZO 200 MG TABS (PHENAZOPYRIDINE HCL) 1 three times a day as needed  #21 x 0   Entered and Authorized by:   Marga Melnick MD   Signed by:   Marga Melnick MD on 03/19/2010   Method used:   [facility]ed to ...       [facility] [phone][facility] (retail)       [address]       [place], [place]  [address]       Ph: [phone]       Fax: [phone]   RxID:   [id number] NITROFURANTOIN MONOHYD MACRO 100 MG CAPS (NITROFURANTOIN MONOHYD MACRO) 1 two times a day with 8 oz water  #14 x 0   Entered and Authorized by:   Marga Melnick MD   Signed by:   Marga Melnick MD on 03/19/2010   Method used:   Faxed to ...       [facility] [phone]* (retail)       [address]       [place], [place]  [address]       Ph: [phone]       Fax: [phone]   RxID:   [id number]    Orders Added: 1)  UA Dipstick w/o Micro (manual) [81002] 2)  Specimen Handling [99000] 3)  T-Culture, Urine [[id number]] 4)  Est. Patient Level IV [23762]     Laboratory Results   Urine Tests    Routine Urinalysis   Color: straw Appearance: Clear Glucose: negative   (Normal Range: Negative) Bilirubin: negative    (Normal Range: Negative) Ketone: negative   (Normal Range: Negative) Spec. Gravity: >=1.030   (Normal Range: 1.003-1.035) Blood: trace-lysed   (Normal Range: Negative) pH: 6.0   (Normal Range: 5.0-8.0) Protein: negative   (Normal Range: Negative) Urobilinogen: 0.2   (Normal Range: 0-1) Nitrite: negative   (Normal Range: Negative) Leukocyte Esterace: negative   (Normal Range: Negative)    Comments: sent for culture

## 2010-04-29 NOTE — Letter (Signed)
Summary: Cancer Screening/[facility] Personalized Risk Profile  Cancer Screening/[facility] Personalized Risk Profile   Imported By: Lanelle Bal 03/10/2010 15:30:52  _____________________________________________________________________  External Attachment:    Type:   Image     Comment:   External Document

## 2010-05-20 ENCOUNTER — Encounter: Payer: Self-pay | Admitting: Internal Medicine

## 2010-05-20 ENCOUNTER — Other Ambulatory Visit: Payer: Self-pay | Admitting: Internal Medicine

## 2010-05-20 ENCOUNTER — Ambulatory Visit (INDEPENDENT_AMBULATORY_CARE_PROVIDER_SITE_OTHER): Payer: 59 | Admitting: Internal Medicine

## 2010-05-20 DIAGNOSIS — S0990XA Unspecified injury of head, initial encounter: Secondary | ICD-10-CM | POA: Insufficient documentation

## 2010-05-20 DIAGNOSIS — E039 Hypothyroidism, unspecified: Secondary | ICD-10-CM

## 2010-05-20 DIAGNOSIS — R209 Unspecified disturbances of skin sensation: Secondary | ICD-10-CM

## 2010-05-20 DIAGNOSIS — H02409 Unspecified ptosis of unspecified eyelid: Secondary | ICD-10-CM

## 2010-05-20 LAB — TSH: TSH: 0.73 u[IU]/mL (ref 0.35–5.50)

## 2010-05-21 ENCOUNTER — Ambulatory Visit: Payer: Self-pay | Admitting: Internal Medicine

## 2010-05-25 ENCOUNTER — Telehealth (INDEPENDENT_AMBULATORY_CARE_PROVIDER_SITE_OTHER): Payer: Self-pay | Admitting: *Deleted
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
## 2010-06-03 NOTE — Progress Notes (Signed)
Summary: wants medication from Dr Alwyn Ren (lmom 2/28)  Phone Note Call from Patient   Caller: Patient Summary of Call: patient called to see if Dr Alwyn Ren could call medication in for her-----she saw him last Thursday and feels like she is feeling worse;  she says it is going into both ears;  when she blows her nose, it is clear, but when she coughs, she is bringing up light green stuff  She says she has no fever, her throat is scratchy-----she has been taking Musinex as directed  [facility] [address] Initial call taken by: Jerolyn Shin,  May 25, 2010 11:50 AM  Follow-up for Phone Call        Please advise. Lucious Groves CMA  May 25, 2010 11:53 AM   Additional Follow-up for Phone Call Additional follow up Details #1::        azithromycin 250 mg #6, 2 day then 1 once daily  Additional Follow-up by: Marga Melnick MD,  May 25, 2010 4:27 PM    Additional Follow-up for Phone Call Additional follow up Details #2::    I called pt on home # left message for pt to call back. Army Fossa CMA  May 25, 2010 4:32 PM  left message on machine prescription sent to pharmacy ....Marland KitchenMarland KitchenDoristine Devoid CMA  May 26, 2010 4:46 PM   New/Updated Medications: AZITHROMYCIN 250 MG TABS (AZITHROMYCIN) Take 2 today, then once daily. Prescriptions: AZITHROMYCIN 250 MG TABS (AZITHROMYCIN) Take 2 today, then once daily.  #6 x 0   Entered by:   Army Fossa CMA   Authorized by:   Marga Melnick MD   Signed by:   Army Fossa CMA on 05/25/2010   Method used:   Electronically to        [facility] [phone]* (retail)       [address]       [place], [place]  [address]       Ph: [phone]       Fax: [phone]   RxID:   [id number]

## 2010-06-07 ENCOUNTER — Telehealth (INDEPENDENT_AMBULATORY_CARE_PROVIDER_SITE_OTHER): Payer: Self-pay | Admitting: *Deleted

## 2010-06-09 ENCOUNTER — Telehealth (INDEPENDENT_AMBULATORY_CARE_PROVIDER_SITE_OTHER): Payer: Self-pay | Admitting: *Deleted

## 2010-06-15 NOTE — Progress Notes (Signed)
Summary: Synthroid refill---call her first before refilling  Phone Note Refill Request Message from:  Patient on June 07, 2010 2:47 PM  Refills Requested: Medication #1:  SYNTHROID 112 MCG TABS 1 by mouth once daily EXCEPT 1/2  T [facility] [address]        needs call at work  [phone] before prescription is filled--she had questions  Initial call taken by: Jerolyn Shin,  June 07, 2010 2:48 PM  Follow-up for Phone Call        Left message on machine for patient to return call when avaliable, Reason for call:   patient's request Follow-up by: Shonna Chock CMA,  June 07, 2010 3:32 PM  Additional Follow-up for Phone Call Additional follow up Details #1::        Patient called back and indicated she needs a refill and requested an explaination as to why med was being decreased vs increased. Patient informed as to how the thyroid function works  Additional Follow-up by: Shonna Chock CMA,  June 07, 2010 4:51 PM    Prescriptions: SYNTHROID 112 MCG TABS (LEVOTHYROXINE SODIUM) 1 by mouth once daily EXCEPT 1/2  T , Th & Sat  #30 x 3   Entered by:   Shonna Chock CMA   Authorized by:   Marga Melnick MD   Signed by:   Shonna Chock CMA on 06/07/2010   Method used:   Electronically to        [facility] [address] (retail)       [phone]       [place], [place]  [address]       Ph: [phone]       Fax: [phone]   RxID:   [id number]

## 2010-06-15 NOTE — Progress Notes (Signed)
Summary: about her synthroid  Phone Note Call from Patient Call back at [phone]   Summary of Call: Patient is calling about her synthroid being call in for one month. Need this call in for 3 month which would cost her $7 dollar  which would be cheaper instead of $4 for one month every month. Please noted this in my file. Initial call taken by: Freddy Jaksch,  June 09, 2010 1:21 PM    New/Updated Medications: SYNTHROID 112 MCG TABS (LEVOTHYROXINE SODIUM) 1 by mouth once daily EXCEPT 1/2  T , Th & Sat. 90 DAY SUPPLY Prescriptions: SYNTHROID 112 MCG TABS (LEVOTHYROXINE SODIUM) 1 by mouth once daily EXCEPT 1/2  T , Th & Sat. 90 DAY SUPPLY  #90 x 1   Entered by:   Shonna Chock CMA   Authorized by:   Marga Melnick MD   Signed by:   Shonna Chock CMA on 06/09/2010   Method used:   Electronically to        [facility]* (retail)       [phone]       [place], [place]  [address]       Ph: [phone]       Fax: [phone]   RxID:   [id number]

## 2010-06-25 ENCOUNTER — Telehealth: Payer: Self-pay | Admitting: *Deleted

## 2010-06-25 MED ORDER — BENZONATATE 100 MG PO CAPS: 100.0000 mg | ORAL_CAPSULE | Freq: Three times a day (TID) | ORAL | Status: DC | PRN

## 2010-06-25 NOTE — Telephone Encounter (Signed)
Per Hop ok to call in tessalon.

## 2010-10-20 ENCOUNTER — Encounter: Payer: Self-pay | Admitting: Internal Medicine

## 2010-11-19 ENCOUNTER — Other Ambulatory Visit: Payer: Self-pay | Admitting: Internal Medicine

## 2010-11-22 NOTE — Telephone Encounter (Signed)
RX sent to pharmacy  

## 2010-11-22 NOTE — Telephone Encounter (Signed)
OK X1 

## 2010-11-22 NOTE — Telephone Encounter (Signed)
Dr.Hopper please advise, not on med list

## 2010-12-02 ENCOUNTER — Telehealth: Payer: Self-pay | Admitting: Internal Medicine

## 2010-12-02 DIAGNOSIS — M6530 Trigger finger, unspecified finger: Secondary | ICD-10-CM

## 2010-12-02 NOTE — Telephone Encounter (Signed)
Lipid/Hep/BMP/CBCD/TSH/U-Dip/Stool Cards 272.4/995.20/244.9/V70.0  Dr.Hopper please advise on request for additional test for inflammation

## 2010-12-06 NOTE — Telephone Encounter (Signed)
Sed rate ( arthralgia)

## 2010-12-07 MED ORDER — TRAMADOL HCL 50 MG PO TABS: ORAL_TABLET | ORAL | Status: DC

## 2010-12-07 NOTE — Telephone Encounter (Signed)
Per Dr.Hopper ok to fill then referral to Rheumatology (patient using too frequently)  Left message on voicemail for patient to return call when available

## 2010-12-07 NOTE — Telephone Encounter (Signed)
Added info to lab appt on 11/9

## 2010-12-08 NOTE — Telephone Encounter (Signed)
Patient also mentioned that she use to see a hand specialist before she moved here for trigger finger. Patient would like to know if Dr.Hopper will order a referral for her to a hand specialist here

## 2010-12-08 NOTE — Telephone Encounter (Signed)
See what Rheumatologist says first; he may be able to address this

## 2010-12-08 NOTE — Telephone Encounter (Signed)
Spoke with patient, patient stated that she did not request medication, medication is on an automatic refill request. The pharmacy requested in error, patient would like for Dr.Hopper to be assured that she is not over using medication.   Will forward to Dr.Hopper as a Lorain Childes

## 2010-12-09 NOTE — Telephone Encounter (Signed)
Discussed with Dr.Hopper, patient not seeing rheumatology. Dr Alwyn Ren then ok'd hand specialist referral

## 2010-12-10 ENCOUNTER — Emergency Department (HOSPITAL_BASED_OUTPATIENT_CLINIC_OR_DEPARTMENT_OTHER)
Admission: EM | Admit: 2010-12-10 | Discharge: 2010-12-10 | Disposition: A | Discharge status: Home / Self Care | Payer: 59 | Attending: Emergency Medicine | Admitting: Emergency Medicine

## 2010-12-10 ENCOUNTER — Emergency Department (INDEPENDENT_AMBULATORY_CARE_PROVIDER_SITE_OTHER): Payer: 59

## 2010-12-10 ENCOUNTER — Other Ambulatory Visit: Payer: Self-pay

## 2010-12-10 ENCOUNTER — Telehealth: Payer: Self-pay

## 2010-12-10 ENCOUNTER — Encounter (HOSPITAL_BASED_OUTPATIENT_CLINIC_OR_DEPARTMENT_OTHER): Payer: Self-pay | Admitting: *Deleted

## 2010-12-10 DIAGNOSIS — R0789 Other chest pain: Secondary | ICD-10-CM

## 2010-12-10 DIAGNOSIS — R0602 Shortness of breath: Secondary | ICD-10-CM | POA: Insufficient documentation

## 2010-12-10 DIAGNOSIS — R071 Chest pain on breathing: Secondary | ICD-10-CM | POA: Insufficient documentation

## 2010-12-10 DIAGNOSIS — Z79899 Other long term (current) drug therapy: Secondary | ICD-10-CM | POA: Insufficient documentation

## 2010-12-10 DIAGNOSIS — R112 Nausea with vomiting, unspecified: Secondary | ICD-10-CM

## 2010-12-10 DIAGNOSIS — E785 Hyperlipidemia, unspecified: Secondary | ICD-10-CM | POA: Insufficient documentation

## 2010-12-10 DIAGNOSIS — E079 Disorder of thyroid, unspecified: Secondary | ICD-10-CM | POA: Insufficient documentation

## 2010-12-10 DIAGNOSIS — R61 Generalized hyperhidrosis: Secondary | ICD-10-CM

## 2010-12-10 DIAGNOSIS — R079 Chest pain, unspecified: Secondary | ICD-10-CM

## 2010-12-10 LAB — BASIC METABOLIC PANEL
BUN: 12 mg/dL (ref 6–23)
CO2: 29 mEq/L (ref 19–32)
Calcium: 9.6 mg/dL (ref 8.4–10.5)
Chloride: 102 mEq/L (ref 96–112)
Creatinine, Ser: 0.8 mg/dL (ref 0.50–1.10)
GFR calc Af Amer: >60 mL/min (ref >60)
GFR calc non Af Amer: >60 mL/min (ref >60)
Glucose, Bld: 119 mg/dL — ABNORMAL HIGH (ref 70–99)
Potassium: 3.3 mEq/L — ABNORMAL LOW (ref 3.5–5.1)
Sodium: 140 mEq/L (ref 135–145)

## 2010-12-10 LAB — DIFFERENTIAL
Basophils Absolute: 0 10*3/uL (ref 0.0–0.1)
Basophils Relative: 0 % (ref 0–1)
Eosinophils Absolute: 0.2 10*3/uL (ref 0.0–0.7)
Eosinophils Relative: 3 % (ref 0–5)
Lymphocytes Relative: 51 % — ABNORMAL HIGH (ref 12–46)
Lymphs Abs: 3.1 10*3/uL (ref 0.7–4.0)
Monocytes Absolute: 0.4 10*3/uL (ref 0.1–1.0)
Monocytes Relative: 7 % (ref 3–12)
Neutro Abs: 2.4 10*3/uL (ref 1.7–7.7)
Neutrophils Relative %: 39 % — ABNORMAL LOW (ref 43–77)

## 2010-12-10 LAB — CBC
HCT: 36.6 % (ref 36.0–46.0)
Hemoglobin: 12 g/dL (ref 12.0–15.0)
MCH: 30.2 pg (ref 26.0–34.0)
MCHC: 32.8 g/dL (ref 30.0–36.0)
MCV: 92 fL (ref 78.0–100.0)
Platelets: 351 10*3/uL (ref 150–400)
RBC: 3.98 MIL/uL (ref 3.87–5.11)
RDW: 13.2 % (ref 11.5–15.5)
WBC: 6 10*3/uL (ref 4.0–10.5)

## 2010-12-10 LAB — CARDIAC PANEL(CRET KIN+CKTOT+MB+TROPI)
CK, MB: 2.2 ng/mL (ref 0.3–4.0)
Relative Index: INVALID (ref 0.0–2.5)
Total CK: 76 U/L (ref 7–177)
Troponin I: <0.3 ng/mL (ref <0.30)

## 2010-12-10 MED ORDER — NAPROXEN 250 MG PO TABS
500.0000 mg | ORAL_TABLET | Freq: Once | ORAL | Status: AC
  Administered 2010-12-10: 500 mg via ORAL
  Filled 2010-12-10: qty 2

## 2010-12-10 MED ORDER — NITROGLYCERIN 0.4 MG SL SUBL
0.4000 mg | SUBLINGUAL_TABLET | SUBLINGUAL | Status: DC | PRN
  Administered 2010-12-10 (×2): 0.4 mg via SUBLINGUAL
  Filled 2010-12-10: qty 25

## 2010-12-10 MED ORDER — ASPIRIN 81 MG PO CHEW
CHEWABLE_TABLET | ORAL | Status: AC
  Administered 2010-12-10: 81 mg via ORAL
  Filled 2010-12-10: qty 1

## 2010-12-10 MED ORDER — KETOROLAC TROMETHAMINE 15 MG/ML IJ SOLN
15.0000 mg | Freq: Once | INTRAMUSCULAR | Status: AC
  Administered 2010-12-10: 15 mg via INTRAVENOUS

## 2010-12-10 MED ORDER — ASPIRIN 81 MG PO CHEW
81.0000 mg | CHEWABLE_TABLET | Freq: Once | ORAL | Status: AC
  Administered 2010-12-10: 81 mg via ORAL

## 2010-12-10 MED ORDER — GI COCKTAIL ~~LOC~~
30.0000 mL | Freq: Once | ORAL | Status: AC
  Administered 2010-12-10: 30 mL via ORAL

## 2010-12-10 MED ORDER — ALBUTEROL SULFATE HFA 108 (90 BASE) MCG/ACT IN AERS
2.0000 | INHALATION_SPRAY | RESPIRATORY_TRACT | Status: DC | PRN
  Administered 2010-12-10: 2 via RESPIRATORY_TRACT
  Filled 2010-12-10: qty 6.7

## 2010-12-10 MED ORDER — SODIUM CHLORIDE 0.9 % IV BOLUS (SEPSIS)
250.0000 mL | Freq: Once | INTRAVENOUS | Status: AC
  Administered 2010-12-10: 250 mL via INTRAVENOUS

## 2010-12-10 MED ORDER — SODIUM CHLORIDE 0.9 % IV SOLN: Freq: Once | INTRAVENOUS | Status: DC

## 2010-12-10 MED ORDER — KETOROLAC TROMETHAMINE 15 MG/ML IJ SOLN
INTRAMUSCULAR | Status: AC
  Administered 2010-12-10: 15 mg via INTRAVENOUS
  Filled 2010-12-10: qty 1

## 2010-12-10 MED ORDER — GI COCKTAIL ~~LOC~~
ORAL | Status: AC
  Filled 2010-12-10: qty 30

## 2010-12-10 MED ORDER — NAPROXEN SODIUM 220 MG PO TABS: ORAL_TABLET | ORAL | Status: DC

## 2010-12-10 MED ORDER — ASPIRIN 81 MG PO CHEW: 81.0000 mg | CHEWABLE_TABLET | Freq: Once | ORAL | Status: DC

## 2010-12-10 NOTE — ED Provider Notes (Addendum)
History     CSN: [id number] Arrival date & time: 12/10/2010  3:09 AM   Chief Complaint  Patient presents with  . Chest Pain     (Include location/radiation/quality/duration/timing/severity/associated sxs/prior treatment) HPI This is a 65 year old white female with a history of chest pain since 11:00 yesterday morning. The pain has been intermittent with no improvement with rest and no exacerbation with exertion; there has been exacerbation with movement of the chest as well as with taking deep breaths. The pain is described as pressure. It was mild yesterday morning but has become moderate to severe this morning. It is located in the left parasternal area and fairly well localized. There was some radiation to the left upper chest earlier but none now. There's been no associated diaphoresis nausea or dyspnea. She has a history of GERD but states this is different from that. She took aspirin and an antacid without relief. She states she has a productive cough due to cold she's had for several days. She denies fever.  Past Medical History  Diagnosis Date  . Thyroid disease     hypothyroidism  . GERD (gastroesophageal reflux disease)     Barretts esophagus;pmhof,h pylori gastritis treated 2008  . Allergy     rhinitis  . Colon polyps     pmh ofx4 since 1989,adenomatous& hyperplastic  . Uterine fibroids affecting pregnancy   . Hyperlipidemia     LDLgoal=<90(ideally)based on NMR Lipoprofile 03/06/2008  . Cholelithiasis     simple hepatic cyst as per [place] 01/2006  . Vitamin D deficiency      Past Surgical History  Procedure Date  . Colonoscopy w/ polypectomy     x4  . Arthroscopy for meniscal tear 2007    Family History  Problem Relation Age of Onset  . Cancer Mother     lung, thyroid  . Hyperthyroidism Mother   . Cancer Father     bone  . Cancer Sister     ovarian  . Arthritis Maternal Grandmother   . Cancer Maternal Grandmother     cervical    History  Substance Use  Topics  . Smoking status: Never Smoker   . Smokeless tobacco: Not on file  . Alcohol Use: Yes     rarely    OB History    Grav Para Term Preterm Abortions TAB SAB Ect Mult Living                  Review of Systems  All other systems reviewed and are negative.    Allergies  Review of patient's allergies indicates not on file.  Home Medications   Current Outpatient Rx  Name Route Sig Dispense Refill  . ALOE VERA 470 MG PO CAPS Oral Take by mouth. One by mouth  daily     . ASPIRIN 81 MG PO TABS Oral Take 81 mg by mouth daily. One by mouth a couple x weekly     . AZITHROMYCIN 250 MG PO TABS Oral Take 250 mg by mouth daily. One today then once daily     . BENZONATATE 100 MG PO CAPS Oral Take 1 capsule (100 mg total) by mouth 3 (three) times daily as needed for cough. 30 capsule 0    1 every eight hours as needed for cough.  Marland Kitchen VITAMIN D3 1000 UNITS PO CAPS Oral Take 1,000 Int'l Units by mouth. 2 by mouth daily     . DESONIDE 0.05 % EX GEL Topical Apply topically 2 (two) times daily.      Marland Kitchen  ESOMEPRAZOLE MAGNESIUM 20 MG PO CPDR Oral Take 20 mg by mouth 2 (two) times daily.      . FUROSEMIDE 20 MG PO TABS Oral Take 20 mg by mouth daily as needed.      Marland Kitchen GABAPENTIN 100 MG PO CAPS Oral Take 100 mg by mouth. 1 every 8 hrs as needed for burning     . KETOCONAZOLE 2 % EX CREA Topical Apply topically 2 (two) times daily.      . ACIDOPHILUS PO CAPS Oral Take by mouth. One by mouth daily     . LEVOTHYROXINE SODIUM 112 MCG PO TABS Oral Take 112 mcg by mouth. 1 by mouth once daily EXCEPT 1/2 T,TH & Sat. 90 day supply     . LIDOCAINE 5 % EX PTCH Transdermal Place 1 patch onto the skin daily. Remove & Discard patch within 12 hours or as directed by MD     . MONTELUKAST SODIUM 10 MG PO TABS Oral Take 10 mg by mouth daily as needed.      . CENTRUM ULTRA WOMENS PO TABS Oral Take by mouth.      Marland Kitchen FISH OIL 1000 MG PO CAPS Oral Take 1,000 mg by mouth daily.      Marland Kitchen PHENAZOPYRIDINE HCL 200 MG PO TABS  Oral Take 200 mg by mouth 3 (three) times daily as needed.      Marland Kitchen SIMVASTATIN 40 MG PO TABS Oral Take 40 mg by mouth at bedtime.      . TRAMADOL HCL 50 MG PO TABS  1/2-1 by mouth every 6 hours as needed for joint pain 90 tablet 0    PATIENT WOULD LIKE A 90 DAYS SUPPLY    Physical Exam    BP 115/70  Pulse 75  Resp 18  Ht 5\' 2"  (1.575 m)  Wt 230 lb (104.327 kg)  BMI 42.07 kg/m2  SpO2 95%  Physical Exam General: Well-developed, well-nourished female in no acute distress; appearance consistent with age of record HENT: normocephalic, atraumatic Eyes: pupils equal round and reactive to light; extraocular muscles intact Neck: supple Heart: regular rate and rhythm; no murmurs, rubs or gallops Lungs: clear to auscultation bilaterally Chest: Mild left parasternal chest wall tenderness Abdomen: soft; nontender; nondistended; no masses or hepatosplenomegaly; bowel sounds present Extremities: No deformity; full range of motion; pulses normal; no edema Neurologic: Awake, alert and oriented;motor function intact in all extremities and symmetric; no facial droop Skin: Warm and dry Psychiatric: Normal mood and affect   ED Course  Procedures    MDM EKG Interpretation:  Date & Time: 12/10/2010 3:11 AM  Rate: 75  Rhythm: normal sinus rhythm  QRS Axis: normal  Intervals: normal  ST/T Wave abnormalities: normal  Conduction Disutrbances:none  Narrative Interpretation: Poor R-wave progression  Old EKG Reviewed: none available  5:01 AM No change with sublingual nitroglycerin or GI cocktail. We'll try Toradol 15 mg IV.  5:01 AM Significant improvement with IV Toradol.    Nursing notes and vitals signs, including pulse oximetry, reviewed.  Summary of this visit's results, reviewed by myself:  Labs:  Results for orders placed during the hospital encounter of 12/10/10  CBC      Component Value Range   WBC 6.0  4.0 - 10.5 (K/uL)   RBC 3.98  3.87 - 5.11 (MIL/uL)   Hemoglobin 12.0   12.0 - 15.0 (g/dL)   HCT 82.9  56.2 - 13.0 (%)   MCV 92.0  78.0 - 100.0 (fL)   MCH 30.2  26.0 - 34.0 (pg)   MCHC 32.8  30.0 - 36.0 (g/dL)   RDW 16.1  09.6 - 04.5 (%)   Platelets 351  150 - 400 (K/uL)  DIFFERENTIAL      Component Value Range   Neutrophils Relative 39 (*) 43 - 77 (%)   Neutro Abs 2.4  1.7 - 7.7 (K/uL)   Lymphocytes Relative 51 (*) 12 - 46 (%)   Lymphs Abs 3.1  0.7 - 4.0 (K/uL)   Monocytes Relative 7  3 - 12 (%)   Monocytes Absolute 0.4  0.1 - 1.0 (K/uL)   Eosinophils Relative 3  0 - 5 (%)   Eosinophils Absolute 0.2  0.0 - 0.7 (K/uL)   Basophils Relative 0  0 - 1 (%)   Basophils Absolute 0.0  0.0 - 0.1 (K/uL)  BASIC METABOLIC PANEL      Component Value Range   Sodium 140  135 - 145 (mEq/L)   Potassium 3.3 (*) 3.5 - 5.1 (mEq/L)   Chloride 102  96 - 112 (mEq/L)   CO2 29  19 - 32 (mEq/L)   Glucose, Bld 119 (*) 70 - 99 (mg/dL)   BUN 12  6 - 23 (mg/dL)   Creatinine, Ser 4.09  0.50 - 1.10 (mg/dL)   Calcium 9.6  8.4 - 81.1 (mg/dL)   GFR calc non Af Amer >60  >60 (mL/min)   GFR calc Af Amer >60  >60 (mL/min)  CARDIAC PANEL(CRET KIN+CKTOT+MB+TROPI)      Component Value Range   Total CK 76  7 - 177 (U/L)   CK, MB 2.2  0.3 - 4.0 (ng/mL)   Troponin I <0.30  <0.30 (ng/mL)   Relative Index RELATIVE INDEX IS INVALID  0.0 - 2.5     Imaging Studies: Dg Chest Portable 1 View  12/10/2010  *RADIOLOGY REPORT*  Clinical Data: Chest pain, shortness of breath, nausea and vomiting; diaphoresis.  PORTABLE CHEST - 1 VIEW  Comparison: Chest radiograph performed 03/27/2006  Findings: The lungs are hypoexpanded.  Vascular congestion is noted, with bilateral central and left basilar airspace opacity, likely reflecting pulmonary edema.  Underlying left basilar pneumonia cannot be excluded.  A small left pleural effusion is likely present.  No pneumothorax is seen.  The cardiomediastinal silhouette is borderline normal in size.  No acute osseous abnormalities are seen.  IMPRESSION:  1.   Vascular congestion, with bilateral central and left basilar airspace opacity, and likely small left pleural effusion, suspicious for pulmonary edema. 2.  Underlying left basilar pneumonia cannot be excluded; lungs hypoexpanded.  Original Report Authenticated By: Tonia Ghent, M.D.           Hanley Seamen, MD 12/10/10 0505  Hanley Seamen, MD 12/10/10 [phone]

## 2010-12-10 NOTE — Patient Instructions (Signed)
Instructed pt on the proper use of administering albuteral mdi via aerochamber pt tolerated well 

## 2010-12-10 NOTE — Telephone Encounter (Signed)
Patient left message on voicemail, patient was seen in the hospital (ER)-chest pain would like a call to discuss.  Called and spoke with patient, patient was told to f/u with primary. I schedule patient an appointment for Monday and informed her that if Chest pain not resolved to return to the Emergency room, patient stated she was on the way to pick-up med prescribed in the ED and will f/u on Monday and agreed that she will return to ER if Chest Pain not resolved

## 2010-12-10 NOTE — ED Notes (Signed)
C/o chest pains since 11am yesterday. Intermittent in nature until tonight. Described as pressure. Denies SOB/nausea/vomiting/diaphoresis. Mild belching.

## 2010-12-13 ENCOUNTER — Ambulatory Visit: Payer: 59 | Admitting: Internal Medicine

## 2010-12-28 ENCOUNTER — Ambulatory Visit (HOSPITAL_BASED_OUTPATIENT_CLINIC_OR_DEPARTMENT_OTHER): Admission: RE | Admit: 2010-12-28 | Payer: 59 | Source: Ambulatory Visit | Admitting: Orthopedic Surgery

## 2011-01-07 ENCOUNTER — Ambulatory Visit (HOSPITAL_BASED_OUTPATIENT_CLINIC_OR_DEPARTMENT_OTHER)
Admission: RE | Admit: 2011-01-07 | Discharge: 2011-01-07 | Disposition: A | Discharge status: Home / Self Care | Payer: 59 | Source: Ambulatory Visit | Attending: Orthopedic Surgery | Admitting: Orthopedic Surgery

## 2011-01-07 DIAGNOSIS — M6530 Trigger finger, unspecified finger: Secondary | ICD-10-CM | POA: Insufficient documentation

## 2011-01-07 DIAGNOSIS — M65839 Other synovitis and tenosynovitis, unspecified forearm: Secondary | ICD-10-CM | POA: Insufficient documentation

## 2011-01-07 LAB — POCT HEMOGLOBIN-HEMACUE: Hemoglobin: 12.4 g/dL (ref 12.0–15.0)

## 2011-01-12 NOTE — Op Note (Addendum)
NAME:  Kristin Banks, Kristin Banks NO.:  [id number]  MEDICAL RECORD NO.:  [id number]  LOCATION:  [facility]                         FACILITY:  [facility]  PHYSICIAN:  Katy Fitch. Sahmir Weatherbee, M.D. DATE OF BIRTH:  1945/08/25  DATE OF PROCEDURE:  01/07/2011 DATE OF DISCHARGE:  12/10/2010                              OPERATIVE REPORT   PREOPERATIVE DIAGNOSIS:  Chronic locked stenosing tenosynovitis, left thumb, rule out cyst at A1 pulley.  POSTOPERATIVE DIAGNOSIS:  Extremely fibrotic left thumb A1 Pulley.  A myxoid cyst was not identified.  OPERATION:  Release of left thumb A1 pulley and tenolysis of flexor tendon (incidental).  OPERATING SURGEON:  Katy Fitch. Yarimar Lavis, MD  ASSISTANT:  Marveen Reeks. Dasnoit, PA-C.  ANESTHESIA:  2% lidocaine flexor sheath block and left thumb block supplemented by IV sedation.  SUPERVISING ANESTHESIOLOGIST:  Janetta Hora. Gelene Mink, MD  INDICATIONS:  [name] is a 65 year old woman referred through thecourtesy of Dr. Lona Kettle for evaluation and management of a locked left trigger thumb.  She has had a history of episodic triggering for more than a year.  She has had 2 prior injections without relief.  She returned for a clinical examination and was noted to have a locked left trigger thumb.  We advised her to proceed with release of the A1 pulley under local anesthesia and sedation at her convenience.  Questions regarding the anticipated procedure were invited and answered in detail.  The anticipated outcome would be recovery of range of motion of her thumb and relief of the pain.  Clinically, we thought she may have a cyst at the A1 pulley.  Ultimately, direct inspection revealed that she had a very thickened A1 pulley.  DESCRIPTION OF PROCEDURE:  [name] was brought to room 1 of the [facility] and placed in supine position upon the operating table.  Following IV sedation provided under Dr. Thornton Dales supervision, the left hand and  arm were prepped with Betadine followed by injection of 2% lidocaine into the path of the intended incision and into the left thumb flexor sheath.  The left arm was then prepped with Betadine soap and solution and sterilely draped.  A pneumatic tourniquet was applied to the proximal left brachium.  Following exsanguination of the limb with Esmarch bandage, the arterial tourniquet was inflated to 220 mmHg.  Procedure commenced with a short transverse incision directly over the palpably thickened pulley.  Subcutaneous tissues were carefully divided, and a Glorious Peach was used to clear soft tissues from the A1 pulley.  I did not identify a ganglion cyst.  The pulley was markedly thickened and there was a nodule in the flexor tendon.  The A1 pulley was split with scalpel and scissors.  The tendon was delivered and tenolysis completed by simple traction.  Thereafter, full range of motion of the IP joint was recovered.  There were minor steroid changes noted in the tendon, but no sign of tendon abrasion or rupture.  The wound was then repaired with mattress suture of 5-0 nylon.  There were no apparent complications.  For aftercare, Ms. Galyon was placed in a compressive dressing with Xeroflo, sterile gauze, and Coban.  For aftercare, we will see  her back for followup in 1 week for suture removal.  She was provided a prescription for Tylenol with Codeine #3 one p.o. q.4-6 hours p.r.n. pain, 20 tablets without refill.     Katy Fitch Hugo Lybrand, M.D.     RVS/MEDQ  D:  01/07/2011  T:  01/07/2011  Job:  [id number]  cc:   Titus Dubin. Alwyn Ren, MD,FACP,FCCP  Electronically Signed by Josephine Igo M.D. on 01/14/2011 08:17:15 AM

## 2011-01-25 ENCOUNTER — Telehealth: Payer: Self-pay | Admitting: Internal Medicine

## 2011-01-25 MED ORDER — BENZONATATE 100 MG PO CAPS: 100.0000 mg | ORAL_CAPSULE | Freq: Three times a day (TID) | ORAL | Status: DC | PRN

## 2011-01-25 NOTE — Telephone Encounter (Signed)
Pt c/o chest congestion, and dry cough. Pt denies any fever, sore throat or drainage. Pt advise OV Pt decline stating that she has a pending OV scheduled for CPX on 02-11-11. Offer Pt appt she decline. Advise Pt that if she came in with a acute issue that it would have to be address versus the CPX due to insurance billing. Pt states that a cold is not a acute issue. Pt then hung up phone but prior to stated that she did not understand why she needs to come in for a cold.

## 2011-01-25 NOTE — Telephone Encounter (Signed)
Left message to call office. Pt Last OV 03-19-10.

## 2011-01-25 NOTE — Telephone Encounter (Signed)
Left message to call office

## 2011-01-25 NOTE — Telephone Encounter (Signed)
Zicam Melts or Zinc lozenges ; vitamin C 2000 mg daily; & Echinacea for 4-7 days. Report fever, exudate("pus") or progressive pain. Call Rx  to drug store for Tessalon 100 mg every 8 hrs prn #15

## 2011-01-25 NOTE — Telephone Encounter (Signed)
Patient is congested -she refused appt - she said dr hopper has call med in before - [facility]

## 2011-01-28 NOTE — Telephone Encounter (Signed)
Pt aware.

## 2011-02-03 ENCOUNTER — Other Ambulatory Visit: Payer: Self-pay | Admitting: Internal Medicine

## 2011-02-03 DIAGNOSIS — Z0000 Encounter for general adult medical examination without abnormal findings: Secondary | ICD-10-CM

## 2011-02-03 DIAGNOSIS — M2550 Pain in unspecified joint: Secondary | ICD-10-CM

## 2011-02-03 DIAGNOSIS — E039 Hypothyroidism, unspecified: Secondary | ICD-10-CM

## 2011-02-03 DIAGNOSIS — E785 Hyperlipidemia, unspecified: Secondary | ICD-10-CM

## 2011-02-03 DIAGNOSIS — T887XXA Unspecified adverse effect of drug or medicament, initial encounter: Secondary | ICD-10-CM

## 2011-02-04 ENCOUNTER — Other Ambulatory Visit (INDEPENDENT_AMBULATORY_CARE_PROVIDER_SITE_OTHER): Payer: 59

## 2011-02-04 DIAGNOSIS — E039 Hypothyroidism, unspecified: Secondary | ICD-10-CM

## 2011-02-04 DIAGNOSIS — M2550 Pain in unspecified joint: Secondary | ICD-10-CM

## 2011-02-04 DIAGNOSIS — T887XXA Unspecified adverse effect of drug or medicament, initial encounter: Secondary | ICD-10-CM

## 2011-02-04 DIAGNOSIS — E785 Hyperlipidemia, unspecified: Secondary | ICD-10-CM

## 2011-02-04 DIAGNOSIS — Z0000 Encounter for general adult medical examination without abnormal findings: Secondary | ICD-10-CM

## 2011-02-04 LAB — BASIC METABOLIC PANEL
BUN: 13 mg/dL (ref 6–23)
CO2: 29 mEq/L (ref 19–32)
Calcium: 9.2 mg/dL (ref 8.4–10.5)
Chloride: 104 mEq/L (ref 96–112)
Creatinine, Ser: 0.8 mg/dL (ref 0.4–1.2)
GFR: 89.9 mL/min (ref >60.00)
Glucose, Bld: 91 mg/dL (ref 70–99)
Potassium: 3.9 mEq/L (ref 3.5–5.1)
Sodium: 140 mEq/L (ref 135–145)

## 2011-02-04 LAB — CBC WITH DIFFERENTIAL/PLATELET
Basophils Absolute: 0 10*3/uL (ref 0.0–0.1)
Basophils Relative: 0.6 % (ref 0.0–3.0)
Eosinophils Absolute: 0.2 10*3/uL (ref 0.0–0.7)
Eosinophils Relative: 3.3 % (ref 0.0–5.0)
HCT: 36.5 % (ref 36.0–46.0)
Hemoglobin: 12.2 g/dL (ref 12.0–15.0)
Lymphocytes Relative: 46.4 % — ABNORMAL HIGH (ref 12.0–46.0)
Lymphs Abs: 2.2 10*3/uL (ref 0.7–4.0)
MCHC: 33.5 g/dL (ref 30.0–36.0)
MCV: 94 fl (ref 78.0–100.0)
Monocytes Absolute: 0.2 10*3/uL (ref 0.1–1.0)
Monocytes Relative: 4 % (ref 3.0–12.0)
Neutro Abs: 2.1 10*3/uL (ref 1.4–7.7)
Neutrophils Relative %: 45.7 % (ref 43.0–77.0)
Platelets: 333 10*3/uL (ref 150.0–400.0)
RBC: 3.88 Mil/uL (ref 3.87–5.11)
RDW: 14.8 % — ABNORMAL HIGH (ref 11.5–14.6)
WBC: 4.7 10*3/uL (ref 4.5–10.5)

## 2011-02-04 LAB — LIPID PANEL
Cholesterol: 173 mg/dL (ref 0–200)
HDL: 43.4 mg/dL (ref >39.00)
LDL Cholesterol: 108 mg/dL — ABNORMAL HIGH (ref 0–99)
Total CHOL/HDL Ratio: 4
Triglycerides: 108 mg/dL (ref 0.0–149.0)
VLDL: 21.6 mg/dL (ref 0.0–40.0)

## 2011-02-04 LAB — TSH: TSH: 1.1 u[IU]/mL (ref 0.35–5.50)

## 2011-02-04 LAB — SEDIMENTATION RATE: Sed Rate: 47 mm/hr — ABNORMAL HIGH (ref 0–22)

## 2011-02-04 LAB — HEPATIC FUNCTION PANEL
ALT: 16 U/L (ref 0–35)
AST: 19 U/L (ref 0–37)
Albumin: 3.5 g/dL (ref 3.5–5.2)
Alkaline Phosphatase: 75 U/L (ref 39–117)
Bilirubin, Direct: 0.1 mg/dL (ref 0.0–0.3)
Total Bilirubin: 0.7 mg/dL (ref 0.3–1.2)
Total Protein: 7.5 g/dL (ref 6.0–8.3)

## 2011-02-11 ENCOUNTER — Ambulatory Visit (INDEPENDENT_AMBULATORY_CARE_PROVIDER_SITE_OTHER): Payer: 59 | Admitting: Internal Medicine

## 2011-02-11 ENCOUNTER — Encounter: Payer: Self-pay | Admitting: Internal Medicine

## 2011-02-11 DIAGNOSIS — Z0000 Encounter for general adult medical examination without abnormal findings: Secondary | ICD-10-CM

## 2011-02-11 DIAGNOSIS — E559 Vitamin D deficiency, unspecified: Secondary | ICD-10-CM

## 2011-02-11 DIAGNOSIS — M2550 Pain in unspecified joint: Secondary | ICD-10-CM

## 2011-02-11 DIAGNOSIS — E039 Hypothyroidism, unspecified: Secondary | ICD-10-CM

## 2011-02-11 NOTE — Patient Instructions (Signed)
Preventive Health Care: Exercise  30-45  minutes a day, 3-4 days a week. Walking is especially valuable in preventing Osteoporosis. Eat a low-fat diet with lots of fruits and vegetables, up to 7-9 servings per day.Consume less than 30 grams of sugar per day from foods & drinks with High Fructose Corn Syrup as # 1,2,3 or #4 on label. Health Care Power of Attorney & Living Will place you in charge of your health care  decisions. Verify these are  in place. .  

## 2011-02-11 NOTE — Progress Notes (Signed)
  Subjective:    Patient ID: Kristin Banks, female    DOB: 08/27/1945, 65 y.o.   MRN: [id number]  HPI  She  is here for a physical;acute issues include atypical chest pain for which she was seen in ER. NSAIDS relieved this after 1 week.      Review of Systems  Chest pain, palpitations- resolved       Dyspnea- no Lightheadedness,Syncope- no    Edema- occasionally, responsive to Furosemide Polyuria/phagia/dipsia- no       Visual problems- occasional blurring in am Abd pain, bowel changes- no   Muscle aches- no        Objective:   Physical Exam Gen.:  well-nourished in appearance. Alert, appropriate and cooperative throughout exam. Head: Normocephalic without obvious abnormalities  Eyes: No corneal or conjunctival inflammation noted. Pupils equal round reactive to light. With accommodation  OS drifts laterally. Fundal exam is benign without hemorrhages, exudate, papilledema. Extraocular motion intact. Vision grossly normal. Ears: External  ear exam reveals no significant lesions or deformities. Canals clear .TMs normal. Hearing is grossly normal bilaterally. Nose: External nasal exam reveals no deformity or inflammation. Nasal mucosa are pink and moist. No lesions or exudates noted.  Mouth: Oral mucosa and oropharynx reveal no lesions or exudates. Teeth in good repair. Neck: No deformities, masses, or tenderness noted. Range of motion &. Thyroid  normal. Lungs: Normal respiratory effort; chest expands symmetrically. Lungs are clear to auscultation without rales, wheezes, or increased work of breathing. Heart: Normal rate and rhythm. Normal S1 and S2. No gallop, click, or rub. No murmur. S 4 Abdomen: Bowel sounds normal; abdomen soft. No masses, organomegaly or hernias noted. Genitalia: Dr Dareen Piano   .                                                                                   Musculoskeletal/extremities: No deformity or scoliosis noted of  the thoracic or lumbar spine. No  clubbing, cyanosis, edema, or deformity noted. Range of motion essentially normal;crepitus R knee  .Tone & strength  normal.Joints normal. Nail health  good. Vascular: Carotid, radial artery, dorsalis pedis and  posterior tibial pulses are full and equal. No bruits present. Neurologic: Alert and oriented x3. Deep tendon reflexes symmetrical and normal.          Skin: Intact without suspicious lesions or rashes. Lymph: No cervical, axillary  lymphadenopathy present. Psych: Mood and affect are normal. Normally interactive                                                                                         Assessment & Plan:  #1 comprehensive physical exam; no acute findings #2 see Problem List with Assessments & Recommendations Plan: see Orders

## 2011-02-12 LAB — VITAMIN D 25 HYDROXY (VIT D DEFICIENCY, FRACTURES): Vit D, 25-Hydroxy: 35 ng/mL (ref 30–89)

## 2011-02-12 LAB — RHEUMATOID FACTOR: Rhuematoid fact SerPl-aCnc: <10 IU/mL (ref <=14)

## 2011-02-16 ENCOUNTER — Telehealth: Payer: Self-pay

## 2011-02-16 NOTE — Telephone Encounter (Signed)
Left message for pt to call back  °

## 2011-02-16 NOTE — Telephone Encounter (Signed)
Pt c/o discomfort in her lower abdomen, stating that when she works in the yard and when she sweats she gets a UTI. She say that she has not discharge just the discomfort. Please advise

## 2011-02-16 NOTE — Telephone Encounter (Signed)
She can leave a clean catch urine for urinalysis and culture and sensitivity with the name of a pharmacy.

## 2011-02-16 NOTE — Telephone Encounter (Signed)
Spoke with patient, informed her of Dr.Hopper's recommendation. Patient stated she will contact her GYN to see if they will call something in for her

## 2011-03-09 ENCOUNTER — Telehealth: Payer: Self-pay

## 2011-03-09 NOTE — Telephone Encounter (Signed)
Message copied by Maurice Small on Wed Mar 09, 2011  4:37 PM ------      Message from: Pecola Lawless      Created: Sun Mar 06, 2011  6:22 AM       [facility] notified us she is on Ketoconazole; this can NOT be taken with Simavastatin. .Share this information  with the MD who prescribed the Ketoconazole.

## 2011-03-09 NOTE — Telephone Encounter (Signed)
Left message on voicemail for patient with Dr.Hopper's instruction. Patient to follow-up with the doctor who prescribed Ketoconazole

## 2011-03-25 ENCOUNTER — Other Ambulatory Visit: Payer: Self-pay | Admitting: Internal Medicine

## 2011-03-28 ENCOUNTER — Encounter: Payer: Self-pay | Admitting: *Deleted

## 2011-03-28 ENCOUNTER — Telehealth: Payer: Self-pay | Admitting: Internal Medicine

## 2011-03-28 ENCOUNTER — Other Ambulatory Visit: Payer: Self-pay | Admitting: Internal Medicine

## 2011-03-28 ENCOUNTER — Ambulatory Visit (INDEPENDENT_AMBULATORY_CARE_PROVIDER_SITE_OTHER): Payer: 59 | Admitting: *Deleted

## 2011-03-28 DIAGNOSIS — Z23 Encounter for immunization: Secondary | ICD-10-CM

## 2011-03-28 MED ORDER — ZOSTER VACCINE LIVE 19400 UNT/0.65ML ~~LOC~~ SOLR: 0.6500 mL | Freq: Once | SUBCUTANEOUS | Status: DC

## 2011-03-28 MED ORDER — FLUTICASONE PROPIONATE 50 MCG/ACT NA SUSP: 2.0000 | Freq: Every day | NASAL | Status: DC

## 2011-03-28 NOTE — Telephone Encounter (Signed)
error 

## 2011-03-30 MED ORDER — TRAMADOL HCL 50 MG PO TABS: ORAL_TABLET | ORAL | Status: DC

## 2011-03-30 NOTE — Telephone Encounter (Signed)
RX sent

## 2011-04-25 ENCOUNTER — Telehealth: Payer: Self-pay | Admitting: Internal Medicine

## 2011-04-25 MED ORDER — LEVOTHYROXINE SODIUM 112 MCG PO TABS: ORAL_TABLET | ORAL | Status: DC

## 2011-04-25 NOTE — Telephone Encounter (Signed)
Left message to call office

## 2011-04-25 NOTE — Telephone Encounter (Signed)
Patient is requesting a 90 day supply of synthroid to be sent to [facility] on [address]. Her insurance pays better with a 90 day supply instead of 30 day.  Also, patient states that she has been seeing a physician at [facility] for a toenail fungus. The physician has given her two option. 1st option is to take a medication for the fungus but she would need to have labs prior to taking this medication. Apparently, the medication is harmful to the liver. Or, 2nd option is to have surgery. She would like to know what Dr. Alwyn Ren thinks about this.

## 2011-04-25 NOTE — Telephone Encounter (Signed)
Rx sent, Dr.Hopper please advise on patient's concern regarding fungus

## 2011-04-25 NOTE — Telephone Encounter (Signed)
If TSH is up to date; 90 day supply is fine. Toenail fungus responds best to keeping  the nails as dry as possible using a hair dryer. It is not a health risk but cosmetic  unless the patient has uncontrolled diabetes or other health problems such as AIDS or cancer while on chemotherapy.She does not have these issues

## 2011-04-27 NOTE — Telephone Encounter (Signed)
Discuss with patient. Pt states that she does not understanding why Rx keep getting sent for 30 day supply when this med is a daily med and she has always gotten 90 day supply. Pt notes that a 30 day supply is not worth her money. Advise Pt would inform Dr Alwyn Ren assistant of this so that in the future Rx can be sent for 90 day supply.

## 2011-04-27 NOTE — Telephone Encounter (Signed)
I called and informed patient rx was last sent in for 90 with 2 refills, If on #30 given that may be all insurance is allowing. Patient to check back with the pharmacy and insurance company and call if we can further assist.

## 2011-05-12 ENCOUNTER — Other Ambulatory Visit: Payer: Self-pay | Admitting: Internal Medicine

## 2011-05-12 NOTE — Telephone Encounter (Signed)
Refill sent to pharmacy for benzonatate 100mg  #15 x no refills. Left message on pt's voicemail re: rx completion and to call if any questions.

## 2011-05-12 NOTE — Telephone Encounter (Signed)
Pt reports she has nasal congestion and productive cough with clear to yellowish phlegm x 2 days.  Denies fever.  Please advise.

## 2011-05-12 NOTE — Telephone Encounter (Signed)
Left message on machine to return my call. Need more info. Re: need for cough med.

## 2011-05-12 NOTE — Telephone Encounter (Signed)
OK 

## 2011-05-16 ENCOUNTER — Other Ambulatory Visit: Payer: Self-pay | Admitting: Internal Medicine

## 2011-06-14 ENCOUNTER — Telehealth: Payer: Self-pay | Admitting: Internal Medicine

## 2011-06-14 NOTE — Telephone Encounter (Signed)
Nonfasting CBC and differential and vitamin D level ( vitamin D deficiency 268.9, lymphocytosis)

## 2011-06-14 NOTE — Telephone Encounter (Signed)
LMOVM for patient to call office for appt.

## 2011-06-14 NOTE — Telephone Encounter (Signed)
Dr.Hopper please advise if you feel patient is due for any labs. I do not see where she is due for labs

## 2011-06-14 NOTE — Telephone Encounter (Signed)
Patient would like to come in for blood work can you tell me if she needs the same as she got 11.16.2012? If so please put in the orders & I will call her back for the appointment

## 2011-06-15 NOTE — Telephone Encounter (Signed)
Spoke w/pt. She is requesting for "everything" to be checked, not just vit D and CBC. Patient states she can have this done every 3-6 months.

## 2011-06-15 NOTE — Telephone Encounter (Signed)
Left message on voicemail informing patient labs checked in 01/2011 and no indication to recheck all labs for many values were normal. If patient would like all labs rechecked we will gladly do so (can not assure her that insurance will cover). Patient to call back and confirm that she would like all labs rechecked

## 2011-06-16 ENCOUNTER — Telehealth: Payer: Self-pay | Admitting: Internal Medicine

## 2011-06-16 NOTE — Telephone Encounter (Signed)
Verify location she wishes these performed; there is no way the colonoscopy can be scheduled before that day unless there is a cancellation.

## 2011-06-16 NOTE — Telephone Encounter (Signed)
Patient is requesting referrals for both a bone density scan and a colonoscopy. She requests that these be scheduled before 06-27-11. I did advise pt that this may not be enough time to get these scheduled.

## 2011-06-17 NOTE — Telephone Encounter (Signed)
Discuss with patient who states that she does not have a facility in mind but Pt states that she will just hold off for now.

## 2011-06-21 ENCOUNTER — Other Ambulatory Visit: Payer: 59

## 2011-06-21 ENCOUNTER — Encounter: Payer: 59 | Admitting: Internal Medicine

## 2011-06-23 ENCOUNTER — Encounter: Payer: 59 | Admitting: Internal Medicine

## 2011-07-27 ENCOUNTER — Telehealth: Payer: Self-pay | Admitting: *Deleted

## 2011-07-27 MED ORDER — FLUTICASONE PROPIONATE 50 MCG/ACT NA SUSP: 1.0000 | Freq: Two times a day (BID) | NASAL | Status: DC | PRN

## 2011-07-27 NOTE — Telephone Encounter (Signed)
Pt c/o runny/stuffy nose with clear mucous discharge, watery/itching eyes and some drainage. Pt indicated that the montelukast (SINGULAIR) 10 MG tablet daily is not helping with symptoms. Pt has doubled up on med with no relief. Pt notes that she has recently return from [place] where symptoms were worse. Pt would like to know if it would be possible for Dr Alwyn Ren to Rx another allergy med that will help with symptoms..Please advise

## 2011-07-27 NOTE — Telephone Encounter (Signed)
Discuss with patient  

## 2011-07-27 NOTE — Telephone Encounter (Signed)
Plain Mucinex for thick secretions ;force NON dairy fluids . Use a Neti pot daily as needed for sinus congestion; going from open side to congested side . Nasal cleansing in the shower as discussed. Make sure that all residual soap is removed to prevent irritation. Fluticasone 1 spray in each nostril twice a day as needed. Use the "crossover" technique L hand into R nare; R hand into L nare. Plain Allegra 160 daily as needed for itchy eyes & sneezing.

## 2011-09-02 ENCOUNTER — Telehealth: Payer: Self-pay | Admitting: Internal Medicine

## 2011-09-02 NOTE — Telephone Encounter (Signed)
Caller: [place]/Patient; Phone Number: [phone]; Message from caller: Please call pt to advise if and when her last Tetanus shot was given; she cut her fingers earlier in the week.  She left message Tuesday but has not yet received call back.

## 2011-09-02 NOTE — Telephone Encounter (Signed)
Please document tetanus shot record

## 2011-09-02 NOTE — Telephone Encounter (Signed)
Left message on voicemail informing patient to call for nurse visit or she can update at urgent care. Patient w/o vaccine x 5 years or longer (we do not have one on file).

## 2011-09-07 ENCOUNTER — Ambulatory Visit: Payer: 59

## 2011-09-07 ENCOUNTER — Ambulatory Visit (INDEPENDENT_AMBULATORY_CARE_PROVIDER_SITE_OTHER): Payer: 59 | Admitting: *Deleted

## 2011-09-07 DIAGNOSIS — Z23 Encounter for immunization: Secondary | ICD-10-CM

## 2011-11-03 ENCOUNTER — Other Ambulatory Visit: Payer: Self-pay | Admitting: Internal Medicine

## 2011-11-03 NOTE — Telephone Encounter (Signed)
Returned your call, pt. Stated she takes dexilant as an alternate to nexium as she has not met her deductible and the nexium would cost her $700.00 Cb# [phone]

## 2011-11-03 NOTE — Telephone Encounter (Signed)
Dexilant is not on current medication list, we have noted that patient is taking Nexium.  I called and left message on voicemail for patient to return call to discuss

## 2011-12-08 ENCOUNTER — Other Ambulatory Visit: Payer: Self-pay | Admitting: Internal Medicine

## 2011-12-08 ENCOUNTER — Telehealth: Payer: Self-pay | Admitting: Internal Medicine

## 2011-12-08 DIAGNOSIS — N951 Menopausal and female climacteric states: Secondary | ICD-10-CM

## 2011-12-08 NOTE — Telephone Encounter (Signed)
Pt called stated she was referred to [facility] last yr and she is very unhappy with their service as she now has to have another sleep study Pt stated she needed a bone density test as last one was 4-5 yrs ago but we should have that in her chart as she had her records transferred here Cb# [phone]

## 2011-12-08 NOTE — Telephone Encounter (Addendum)
Dr.Hopper please advise on request for BMD and referral to Neuro

## 2011-12-14 NOTE — Telephone Encounter (Signed)
I spoke with patient about referral to Neuro, patient will need to contact her insurance to see who is in network. Patient stated she will handle it on her own. Patient will see a Neuro doctor in [place]

## 2011-12-26 ENCOUNTER — Other Ambulatory Visit: Payer: Medicare Other

## 2011-12-28 ENCOUNTER — Other Ambulatory Visit: Payer: Medicare Other

## 2011-12-30 ENCOUNTER — Ambulatory Visit (INDEPENDENT_AMBULATORY_CARE_PROVIDER_SITE_OTHER)
Admission: RE | Admit: 2011-12-30 | Discharge: 2011-12-30 | Disposition: A | Discharge status: Home / Self Care | Payer: Medicare Other | Source: Ambulatory Visit | Attending: Internal Medicine | Admitting: Internal Medicine

## 2011-12-30 DIAGNOSIS — Z780 Asymptomatic menopausal state: Secondary | ICD-10-CM

## 2011-12-30 DIAGNOSIS — N951 Menopausal and female climacteric states: Secondary | ICD-10-CM

## 2012-01-02 ENCOUNTER — Other Ambulatory Visit: Payer: Medicare Other

## 2012-01-05 ENCOUNTER — Telehealth: Payer: Self-pay

## 2012-01-05 NOTE — Telephone Encounter (Signed)
Left message on voicemail for patient with results, advised report to be mailed

## 2012-01-05 NOTE — Telephone Encounter (Signed)
Message copied by Maurice Small on Thu Jan 05, 2012  5:11 PM ------      Message from: Pecola Lawless      Created: Tue Jan 03, 2012  6:14 PM       Normal T scores on a bone density exam (BMD)  are +1 to -1. Osteopenia would be -1.1 to -2.4. Osteoporosis is defined by a  T score worse than -2.4. Treatment should be considered  with  T scores worse than -1.5, particularly if there is  family history of low bone density or personal  past history of atypical fractue.BMD should be monitored every 25 months.      Recommended lifestyle interventions to prevent  Osteoporosis include calcium 600 mg twice a day  & vitamin D3 supplementation to keep vitamin  D  level @ least 40-60. The usual vitamin D3 dose is 1000 IU daily; but individual dose is determined by annual vitamin D level monitor. Also weight bearing exercise such as  walking 30-45 minutes 3-4  X per week is recommended. Your values reveal minimalOsteopenia.  [employer]

## 2012-01-31 ENCOUNTER — Telehealth: Payer: Self-pay | Admitting: Internal Medicine

## 2012-01-31 NOTE — Telephone Encounter (Signed)
I am sorry but specialists & insurance organizations  require an updated, current  assessment and written note from the Primary Care physician  to review before they  schedule an appointment to assess symptoms or problems. If we do not have such  a current  assessment of your health issue in the chart (electronic medical record);you would need to  make an appointment to create this document THEY'LL REQUIRE. It will be necessary to include the prior evaluations,Xrays and treatments of this symptom in [place] and response to any interventions in that note. If you want to pursue surgical evaluation; please bring that medical history & all medications & supplements to that appointment so I can complete the required document.

## 2012-01-31 NOTE — Telephone Encounter (Signed)
Dr.Hopper please advise 

## 2012-01-31 NOTE — Telephone Encounter (Signed)
pt called stated she see a Urologist in [place] and they advised her she has an enlarged Gall Bladder-t hey recommend she see a Careers adviser here locally-Can Hopp give pt a recommendation of whom to use? CB# [phone]

## 2012-02-01 NOTE — Telephone Encounter (Signed)
Left message on voicemail for patient to return call when available   

## 2012-02-01 NOTE — Telephone Encounter (Signed)
Dr.Hopper please on recommendation

## 2012-02-01 NOTE — Telephone Encounter (Signed)
Pt returned your call. She only wants a recommendation, not a referral. She wants to know who she should call if she has any problems with her gall bladder.

## 2012-02-01 NOTE — Telephone Encounter (Signed)
There is one large surgical group in [place] but no general surgeon would see her without a workup with definitive diagnosis . If she has acute abdominal pain she would need to be seen here or  in the emergency room for definitive workup & diagnosis with referral to General Surgery if indicated

## 2012-02-03 NOTE — Telephone Encounter (Signed)
Dr Claud Kelp

## 2012-02-03 NOTE — Telephone Encounter (Signed)
Spoke with patient, patient informed of Dr.Hopper's recommendation

## 2012-02-03 NOTE — Telephone Encounter (Signed)
Dr.Hopper please give the name of the surgeon that you would recommend

## 2012-02-07 ENCOUNTER — Encounter: Payer: 59 | Admitting: Internal Medicine

## 2012-02-10 ENCOUNTER — Encounter: Payer: 59 | Admitting: Internal Medicine

## 2012-03-06 ENCOUNTER — Encounter: Payer: Self-pay | Admitting: Internal Medicine

## 2012-03-06 ENCOUNTER — Ambulatory Visit (INDEPENDENT_AMBULATORY_CARE_PROVIDER_SITE_OTHER): Payer: Medicare Other | Admitting: Internal Medicine

## 2012-03-06 ENCOUNTER — Ambulatory Visit (INDEPENDENT_AMBULATORY_CARE_PROVIDER_SITE_OTHER): Payer: 59 | Admitting: Internal Medicine

## 2012-03-06 VITALS — BP 118/80 | HR 67 | Temp 97.5°F | Resp 12 | Ht 62.03 in | Wt 230.0 lb

## 2012-03-06 DIAGNOSIS — J019 Acute sinusitis, unspecified: Secondary | ICD-10-CM

## 2012-03-06 DIAGNOSIS — Z23 Encounter for immunization: Secondary | ICD-10-CM

## 2012-03-06 DIAGNOSIS — G4730 Sleep apnea, unspecified: Secondary | ICD-10-CM | POA: Insufficient documentation

## 2012-03-06 DIAGNOSIS — Z0000 Encounter for general adult medical examination without abnormal findings: Secondary | ICD-10-CM

## 2012-03-06 DIAGNOSIS — Z860100 Personal history of colon polyps, unspecified: Secondary | ICD-10-CM

## 2012-03-06 DIAGNOSIS — E039 Hypothyroidism, unspecified: Secondary | ICD-10-CM

## 2012-03-06 DIAGNOSIS — E785 Hyperlipidemia, unspecified: Secondary | ICD-10-CM

## 2012-03-06 DIAGNOSIS — E559 Vitamin D deficiency, unspecified: Secondary | ICD-10-CM

## 2012-03-06 DIAGNOSIS — K2270 Barrett's esophagus without dysplasia: Secondary | ICD-10-CM

## 2012-03-06 LAB — HEPATIC FUNCTION PANEL
ALT: 15 U/L (ref 0–35)
AST: 16 U/L (ref 0–37)
Albumin: 3.8 g/dL (ref 3.5–5.2)
Alkaline Phosphatase: 72 U/L (ref 39–117)
Bilirubin, Direct: 0 mg/dL (ref 0.0–0.3)
Total Bilirubin: 0.7 mg/dL (ref 0.3–1.2)
Total Protein: 7.9 g/dL (ref 6.0–8.3)

## 2012-03-06 LAB — LIPID PANEL
Cholesterol: 207 mg/dL — ABNORMAL HIGH (ref 0–200)
HDL: 40.1 mg/dL (ref >39.00)
Total CHOL/HDL Ratio: 5
Triglycerides: 116 mg/dL (ref 0.0–149.0)
VLDL: 23.2 mg/dL (ref 0.0–40.0)

## 2012-03-06 LAB — CBC WITH DIFFERENTIAL/PLATELET
Basophils Absolute: 0.1 10*3/uL (ref 0.0–0.1)
Basophils Relative: 1.2 % (ref 0.0–3.0)
Eosinophils Absolute: 0.1 10*3/uL (ref 0.0–0.7)
Eosinophils Relative: 2.4 % (ref 0.0–5.0)
HCT: 37.8 % (ref 36.0–46.0)
Hemoglobin: 12.3 g/dL (ref 12.0–15.0)
Lymphocytes Relative: 44.8 % (ref 12.0–46.0)
Lymphs Abs: 1.9 10*3/uL (ref 0.7–4.0)
MCHC: 32.4 g/dL (ref 30.0–36.0)
MCV: 94.8 fl (ref 78.0–100.0)
Monocytes Absolute: 0.2 10*3/uL (ref 0.1–1.0)
Monocytes Relative: 4.3 % (ref 3.0–12.0)
Neutro Abs: 2 10*3/uL (ref 1.4–7.7)
Neutrophils Relative %: 47.3 % (ref 43.0–77.0)
Platelets: 360 10*3/uL (ref 150.0–400.0)
RBC: 3.99 Mil/uL (ref 3.87–5.11)
RDW: 14.8 % — ABNORMAL HIGH (ref 11.5–14.6)
WBC: 4.2 10*3/uL — ABNORMAL LOW (ref 4.5–10.5)

## 2012-03-06 LAB — BASIC METABOLIC PANEL
BUN: 13 mg/dL (ref 6–23)
CO2: 29 mEq/L (ref 19–32)
Calcium: 9.2 mg/dL (ref 8.4–10.5)
Chloride: 102 mEq/L (ref 96–112)
Creatinine, Ser: 0.9 mg/dL (ref 0.4–1.2)
GFR: 83.68 mL/min (ref >60.00)
Glucose, Bld: 97 mg/dL (ref 70–99)
Potassium: 3.9 mEq/L (ref 3.5–5.1)
Sodium: 137 mEq/L (ref 135–145)

## 2012-03-06 LAB — LDL CHOLESTEROL, DIRECT: Direct LDL: 149.1 mg/dL

## 2012-03-06 LAB — TSH: TSH: 2.08 u[IU]/mL (ref 0.35–5.50)

## 2012-03-06 NOTE — Patient Instructions (Addendum)
Preventive Health Care: Exercise  30-45  minutes a day, 3-4 days a week. Walking is especially valuable in preventing Osteoporosis. Eat a low-fat diet with lots of fruits and vegetables, up to 7-9 servings per day.  Consume less than 30 grams of sugar per day from foods & drinks with High Fructose Corn Syrup as #1,2,3 or #4 on label. Health Care Power of Attorney & Living Will place you in charge of your health care  decisions. Verify these are  in place.  The best exercises for the low back include freestyle swimming, stretch aerobics, and yoga.   If you activate My Chart; the results can be released to you as soon as they populate from the lab. If you choose not to use this program; the labs have to be reviewed, copied & mailed causing a delay in getting the results to you.  Review and correct the record as indicated. Please share record with all medical staff seen.

## 2012-03-06 NOTE — Addendum Note (Signed)
Addended by: Maurice Small on: 03/06/2012 01:09 PM   Modules accepted: Orders

## 2012-03-06 NOTE — Progress Notes (Signed)
Subjective:    Patient ID: Kristin Banks, female    DOB: 09/20/1945, 66 y.o.   MRN: [id number]  HPI Medicare Wellness Visit:  The following psychosocial & medical history were reviewed as required by Medicare.   Social history: caffeine: mug 3-4 X/ week , alcohol:  rarely ,  tobacco use : never  & exercise : joined gym.   Home & personal  safety / fall risk: no issues, activities of daily living: no limitations , seatbelt use : yes , and smoke alarm employment : yes .  Power of Attorney/Living Will status : NO  Vision ( as recorded per Nurse) & Hearing  evaluation : Glaucoma suspect @ Ophth exam 9/13. No hearing exam Orientation :oriented X 3 , memory & recall : good, spelling  testing:good,and mood & affect : normal . Depression / anxiety: denied Travel history :never  , immunization status : Flu needed , transfusion history:  no, and preventive health surveillance ( colonoscopies, BMD , etc as per protocol/ SOC): colonoscopy this month in [place], Dental care:  Every 6 months . Chart reviewed &  Updated. Active issues reviewed & addressed.       Review of Systems  She is followed by Dr. Jodi Marble in [place], [place]. He recommended that she establish with a general surgeon in this area because of her known cholelithiasis. She is not have any active symptoms at this time. Specifically she denies any GI symptoms such as abdominal pain, unexplained weight loss, nausea, vomiting, melena, or rectal bleeding. Dr. Jodi Marble is to schedule followup colonoscopy this month     Objective:   Physical Exam Gen.:  well-nourished in appearance. Alert, appropriate and cooperative throughout exam. Head: Normocephalic without obvious abnormalities Eyes: No corneal or conjunctival inflammation noted. Pupils equal round reactive to light and accommodation.  Extraocular motion intact. Vision grossly normal. Ears: External  ear exam reveals no significant lesions or deformities. Canals clear .TMs normal.  Hearing is grossly normal bilaterally. Nose: External nasal exam reveals no deformity or inflammation. Nasal mucosa are pink and moist. No lesions or exudates noted.  Mouth: Oral mucosa and oropharynx reveal no lesions or exudates. Teeth in good repair. Neck: No deformities, masses, or tenderness noted. Range of motion & Thyroid normal. Lungs: Normal respiratory effort; chest expands symmetrically. Lungs are clear to auscultation without rales, wheezes, or increased work of breathing. Heart: Normal rate and rhythm. Normal S1 and S2. No gallop, click, or rub. S4 with slurring w/o murmur. Abdomen: Bowel sounds normal; abdomen soft and nontender. No masses, organomegaly or hernias noted. Genitalia: Dr Malva Limes , Gyn Musculoskeletal/extremities: Accentuated curvature of upper thoracic  spine.  No clubbing, cyanosis, edema, or deformity noted. Range of motion  normal .Tone & strength  normal.Joints normal. Nail health  good. Vascular: Carotid, radial artery, dorsalis pedis and  posterior tibial pulses are full and equal. No bruits present. Neurologic: Alert and oriented x3. Deep tendon reflexes symmetrical and normal.          Skin: Intact without suspicious lesions or rashes. Lymph: No cervical, axillary lymphadenopathy present. Psych: Mood and affect are normal. Normally interactive  Assessment & Plan:  #1 Medicare Wellness Exam; criteria met ; data entered #2 Problem List reviewed  Plan: see Orders

## 2012-03-07 NOTE — Progress Notes (Signed)
  Subjective:    Patient ID: Kristin Banks, female    DOB: 1946-03-27, 66 y.o.   MRN: [id number]  HPI Note started in error   Review of Systems     Objective:   Physical Exam        Assessment & Plan:

## 2012-03-27 ENCOUNTER — Other Ambulatory Visit: Payer: Self-pay | Admitting: Internal Medicine

## 2012-03-27 MED ORDER — LEVOTHYROXINE SODIUM 112 MCG PO TABS: ORAL_TABLET | ORAL | Status: DC

## 2012-03-27 NOTE — Telephone Encounter (Signed)
refill  Levothyroxine (Tab) 112 MCG TAKE ONE TABLET BY MOUTH EVERY DAY. EXCEPT ONE HALF TABLET ON TUESDAYS, THURSDAYS AND SATURDAYS #90, last fill 10.04.13

## 2012-03-27 NOTE — Telephone Encounter (Signed)
Rx sent, patient had recent OV 02/2012 and labs

## 2012-04-13 ENCOUNTER — Telehealth: Payer: Self-pay | Admitting: Internal Medicine

## 2012-04-13 ENCOUNTER — Ambulatory Visit: Payer: Medicare Other | Admitting: Internal Medicine

## 2012-04-13 NOTE — Telephone Encounter (Signed)
Patient to be scheduled for Monday 04/16/2012

## 2012-04-13 NOTE — Telephone Encounter (Signed)
The patient called and is hoping to get a call back from the nurse.  She did not specifically state what concerns she had.  Her callback - [phone]

## 2012-04-13 NOTE — Telephone Encounter (Signed)
X 2 Weeks patient with pain-waist line area, patient subsides after patient is up and moving. Patient states that her diet is good yet she is still gaining weight.  Patient was asked to hold and I would transfer to C-A-N (Triage), patient states she would rather have Dr.Hopper's next available appointment. I offered patient an appointment with Dr.Lowne or Dr.Tabori for today (we had several openings at the time of call), patient refused and indicated again she would rather see Dr.Hopper ONLY. Patient scheduled for Monday @ 1:00 pm, patient instructed if ANY change in symptoms, if pain increases to seek medical attention at Urgent Care or Emergency Room immediately. Patient verbalized understanding

## 2012-04-13 NOTE — Telephone Encounter (Signed)
PT Has been switched to Monday

## 2012-04-13 NOTE — Telephone Encounter (Signed)
PT Scheduled at 1:00pm overbooked

## 2012-04-13 NOTE — Telephone Encounter (Signed)
Message copied by Verner Chol on Fri Apr 13, 2012 12:24 PM ------      Message from: Kristin Banks      Created: Fri Apr 13, 2012 11:14 AM       Please schedule patient for 1 pm on Monday, Weight Gain and pain-waist line area

## 2012-04-16 ENCOUNTER — Ambulatory Visit: Payer: Medicare Other | Admitting: Internal Medicine

## 2012-05-18 ENCOUNTER — Telehealth: Payer: Self-pay | Admitting: Internal Medicine

## 2012-05-18 DIAGNOSIS — E039 Hypothyroidism, unspecified: Secondary | ICD-10-CM

## 2012-05-18 NOTE — Telephone Encounter (Signed)
Spoke with patient, patient has tried everything to lose weight (diet changes and exercise) and is getting nowhere. Patient would like to see someone who specializes in the thyroid, patient really feels like something can be done about thyroid to help her lose weight.  Dr.Hopper please advise on patient's request for referral to Endo

## 2012-05-18 NOTE — Telephone Encounter (Signed)
Dr Everardo All or [place] Code 244.9

## 2012-05-18 NOTE — Telephone Encounter (Signed)
Would like referral to endo

## 2012-05-23 ENCOUNTER — Encounter: Payer: Self-pay | Admitting: Internal Medicine

## 2012-05-28 ENCOUNTER — Ambulatory Visit: Payer: Medicare Other | Admitting: Internal Medicine

## 2012-05-28 ENCOUNTER — Telehealth: Payer: Self-pay | Admitting: Internal Medicine

## 2012-05-28 ENCOUNTER — Encounter: Payer: Self-pay | Admitting: Internal Medicine

## 2012-05-28 ENCOUNTER — Ambulatory Visit (INDEPENDENT_AMBULATORY_CARE_PROVIDER_SITE_OTHER): Payer: 59 | Admitting: Internal Medicine

## 2012-05-28 VITALS — BP 100/62 | HR 83 | Temp 98.0°F | Resp 16 | Ht 62.5 in | Wt 230.0 lb

## 2012-05-28 DIAGNOSIS — E039 Hypothyroidism, unspecified: Secondary | ICD-10-CM

## 2012-05-28 NOTE — Progress Notes (Signed)
Subjective:     Patient ID: Kristin Banks, female   DOB: 05-15-1945, 67 y.o.   MRN: [id number]  HPI [name] is a 67 y/o woman referred by PCP, Dr. Alwyn Ren, for management of hypothyroidism. She has been unable to lose weight, and despite a normal TSH, she wonders if her thyroid function can be improved so that she can lose weight.  Patient has been diagnosed with hypothyroidism in 1991-1992. Her last TSH obtained in 03/06/2012 was 2.08, with all the TSH levels reviewed in Epic being normal: TSH  Date Value Range Status  03/06/2012 2.08  0.35 - 5.50 uIU/mL Final  02/04/2011 1.10  0.35 - 5.50 uIU/mL Final  05/20/2010 0.73  0.35 - 5.50 uIU/mL Final  She tells me she is taking Synthroid in am, along with Dexilant (!)  She weighs 230 pounds (BMI of 42). She fluctuates in weight 6-8 lbs up and down. She started a 10-day diet (now 12 days on it) consisting of: - breakfast: 2 eggs + bacon - lunch: vegetable + meat: chicken, salmon, [place] burger - dinner: salad  - snacks: apples, bananas, berries, nuts No diet sodas since Christmas. She only drinks water. She mentions that she lost 6-7 lbs since she started the diet. She is concerned about the cholesterol that she gets from eating the eggs and bacon every morning. She admits she was not taking her statin before, but now she started to take it consistently since she started this diet. Before, she used to skip breakfast. She also tried to make smoothies with frozen berries, seeds and orange juice and have this in am but noticed that she did not lose weight (but she would eat her regular meals during the day then). She exercises 1-3x a week, at the gym and walking.   She c/o dry skin, hair falling. Uses baby oil and gel. No HAs, no blurry or double vision. No abdominal pain/N/V/D/C. Has pain in R knee, which is "acting up". She was just started on Depo Provera by her ObGyn b/c for low libido and anorgasmia. She is not sure if this is working yet. She is  12-15 years into her menopause.00  I reviewed her chart including office notes, telephone notes, labs, and available scanned records. Patient also has a history of vitamin D deficiency, hyperlipidemia, Barrett's esophagus, and cholelithiasis. She was dx with OSA in 01/2012. She wears a CPAP.  Her BMP was normal 3 mo ago and lipid profile: 207/116/40/150. Mother had thyroid cancer. Sister died of ovarian cancer at 65. Her sister's death was a very traumatic experience for the pt and she became tearful describing this.   Review of Systems Constitutional: + both weight gain/loss, no fatigue, no subjective hyperthermia/hypothermia, hot flushes - improved in latest years, occasional poor sleep Eyes: no blurry vision, no xerophthalmia ENT: no sore throat, no nodules palpated in throat, no dysphagia/odynophagia, no hoarseness Cardiovascular: + CP occasionally when she is preoccupied about her daughter/+ SOB/no palpitations/leg swelling Respiratory: no cough/SOB Gastrointestinal: no N/V/D/C Musculoskeletal: no muscle/joint aches Skin: no rashes, + dry skin, + dry skin Neurological: no tremors/numbness/tingling/dizziness, seldom HAs Psychiatric: + depression/anxiety  Past Medical History  Diagnosis Date  . Thyroid disease     hypothyroidism  . GERD (gastroesophageal reflux disease)     Barretts esophagus;pmhof,h pylori gastritis treated 2008  . Allergy     rhinitis  . Colon polyps     PMH of X 4 since 1989,adenomatous& hyperplastic  . Hyperlipidemia     LDLgoal=<90(ideally)based  on NMR Lipoprofile 03/06/2008  . Cholelithiasis     simple hepatic cyst as per [place] 01/2006  . Vitamin D deficiency    Past Surgical History  Procedure Laterality Date  . Colonoscopy w/ polypectomy  2004    x4; negative 2008  . Arthroscopy for meniscal tear  2007  . Lymph node resection  1973    L neck, benign   History   Social History  . Marital Status: Single    Spouse Name: N/A    Number of Children:  1  . Years of Education: N/A   Occupational History  . 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 Smoking status: Never Smoker   . Smokeless tobacco: Not on file  . Alcohol Use: Yes     Comment: rarely - 1-2/year  . Drug Use: No  . Sexually Active:    Social History Narrative   divorced   Medication Sig  . Aloe Vera 470 MG CAPS Take by mouth. One by mouth  daily   . aspirin 81 MG tablet Take 81 mg by mouth daily. One by mouth a couple x weekly   . Cholecalciferol (VITAMIN D3) 1000 UNITS CAPS Take 1,000 Int'l Units by mouth. 1 by mouth daily  . desonide (DESONATE) 0.05 % gel Apply topically 2 (two) times daily as needed.   Marland Kitchen DEXILANT 60 MG capsule TAKE ONE CAPSULE EVERY DAY  . esomeprazole (NEXIUM) 20 MG capsule Take 20 mg by mouth 2 (two) times daily.    . fluticasone (FLONASE) 50 MCG/ACT nasal spray Place 1 spray into the nose 2 (two) times daily as needed for rhinitis.  . furosemide (LASIX) 20 MG tablet TAKE 1 TABLET EVERY DAY AS NEEDED  . ketoconazole (NIZORAL) 2 % cream Apply topically 2 (two) times daily.    . Lactobacillus (ACIDOPHILUS) CAPS Take by mouth. One by mouth daily   . levothyroxine (SYNTHROID, LEVOTHROID) 112 MCG tablet TAKE ONE TABLET BY MOUTH EVERY DAY. EXCEPT ONE HALF TABLET ON TUESDAYS, THURSDAYS AND SATURDAYS  . lidocaine (LIDODERM) 5 % Place 1 patch onto the skin daily. Remove & Discard patch within 12 hours or as directed by MD   . montelukast (SINGULAIR) 10 MG tablet TAKE 1 TABLET EVERY DAY AS NEEDED  . Multiple Vitamins-Minerals (CENTRUM ULTRA WOMENS) TABS Take by mouth.    Marland Kitchen NEXIUM 40 MG capsule TAKE 1 CAPSULE BY MOUTH TWICE A DAY 30 MINUTES BEFORE MEAL  . Omega-3 Fatty Acids (FISH OIL) 1000 MG CAPS Take 1,000 mg by mouth daily.    . simvastatin (ZOCOR) 40 MG tablet TAKE 1 TABLET AT BEDTIME  . traMADol (ULTRAM) 50 MG tablet 1/2-1 by mouth every 6 hours as needed for joint pain   No current facility-administered medications on file prior to  visit.   No Known Allergies  Family History  Problem Relation Age of Onset  . Cancer Mother     lung, thyroid  . Hyperthyroidism Mother   . Cancer Father     bone  . Ovarian cancer Sister   . Diabetes Sister     resolved with weight loss  . Arthritis Maternal Grandmother   . Cervical cancer Maternal Grandmother   . Diabetes Daughter     resolved post Bariatric surgery  . Stroke Maternal Uncle 62  . Heart disease Neg Hx       Objective:   Physical Exam BP 100/62  Pulse 83  Temp(Src) 98 F (36.7 C) (Oral)  Resp  16  Ht 5' 2.5" (1.588 m)  Wt 230 lb (104.327 kg)  BMI 41.37 kg/m2  SpO2 98% Wt Readings from Last 3 Encounters:  05/28/12 230 lb (104.327 kg)  03/06/12 230 lb (104.327 kg)  02/11/11 233 lb 9.6 oz (105.96 kg)  Constitutional: overweight, in NAD Eyes: PERRLA, EOMI, no exophthalmos ENT: moist mucous membranes, no thyromegaly, no cervical lymphadenopathy Cardiovascular: RRR, No MRG Respiratory: CTA B Gastrointestinal: abdomen soft, NT, ND, BS+ Musculoskeletal: no deformities, strength intact in all 4 Skin: moist, warm, no rashes Neurological: no tremor with outstretched hands, DTR normal in all 4  Assessment:     1. Hypothyroidism - very well controlled on Synthroid 112 mcg daily  2. Obesity - BMI 42  - 10 day diet     Plan:  We had a long discussion about possible reasons why she cannot lose weight. Hypothyroidism is not one of them, as she is very well replaced with her current dose of Synthroid. Being menopausal slows down her metabolism and can contribute to weight gain, as is skipping meals, eating fatty foods and lack of exercise. Depo-provera is also known to cause weight gain but this is a new addition for her.  - I recommended several changes in her diet (please also see pt instructions section) for e.g.: stop her egg and bacon for b'fast and switch back to smoothies made with Almond milk - unsweet instead of OJ  to decrease amount of calories and  sugar; skip meat every other day and try Flat-out bread rolls with humus, tofu, veggies or even [place] bacon if cannot stay off meat. - I also recommended that she stop Depo Provera since she is >10 years postmenopause and since it can cause weight gain. For vaginal dryness can try vaginal estrogen low dose. She will talk with her Melrose Nakayama about this. - She asks me whether I believe that she has a hypothalamic problem as she remember a doctor mentioning this to her in the 93s. When she mentions hypothalamus, she points to her chest, where she was supposed to have a mass biopsied (~20 years ago) but the mass resolved so she did not have to have the Bx at the end. I am not completely sure, but I believe she might have had a thymus problem (?). I explained that as of now, there are no indications in her presentation to point to a hypothalamic or pituitary problem. We discussed about all pituitary hormonal axes and I explained how endocrine pathology would manifest if she had any of the disorders. - I offered to refer her to nutrition but she refused as she mentions that she knows what she should eat - she also asked what she should do for hair falling and I suggested taking a B complex vitamin - I advised her not to take Synthroid with Dexilant, but to separate Synthroid from Calcium, MVI (with iron) and PPI by >4h - I will not schedule a f/u appt but will see her back as the need arises in the future, if her TSH becomes abnormal or if she develops a thyroid nodule

## 2012-05-28 NOTE — Telephone Encounter (Signed)
Please call patient back to advise what she can do for hair loss.

## 2012-05-28 NOTE — Patient Instructions (Signed)
   Please try to do the following food substitutions: - substitute whole grain for white bread or pasta - substitute brown rice for white rice - substitute 90-calorie flat bread pieces for slices of bread when possible - substitute sweet potatoes or yams for white potatoes - substitute humus for margarine - substitute tofu for cheese when possible - substitute almond or rice milk for regular milk (would not drink soy milk daily due to concern for soy estrogen influence on breast cancer risk) - substitute dark chocolate for other sweets when possible - substitute water - can add lemon or orange slices for taste - for diet sodas (artificial sweeteners will trick your body that you can eat sweets without getting calories and will lead you to overeating and weight gain in the long run) - do not skip breakfast or other meals (this will slow down the metabolism and will result in more weight gain over time)  - can try smoothies made from fruit and almond/rice milk in am instead of regular breakfast - can also try old-fashioned (not instant) oatmeal made with almond/rice milk in am - order the dressing on the side when eating salad at a restaurant - eat as little meat as possible - can try juicing, but should not forget that juicing will get rid of the fiber, so would alternate with eating raw veg./fruits or drinking smoothies - use as little oil as possible, even when using olive oil - can dress a salad with a mix of balsamic vinegar and lemon juice, for e.g. - use agave nectar, stevia sugar, or regular sugar rather than artificial sweateners - steam or broil/roast veggies  - snack on veggies/fruit/nuts (unsalted, preferably) when possible, rather than processed foods - reduce or eliminate aspartame in diet (it is in diet sodas, chewing gum, etc) Read the labels!

## 2012-05-28 NOTE — Telephone Encounter (Signed)
Kristin Banks, can you please call pt to start a B complex vitamin (it is over the counter) to see if helps with hair loss.

## 2012-05-31 NOTE — Telephone Encounter (Signed)
Left message on patient cell # per Dr. Smitty Cords may use B Complex Vitamin, OTC, to see if helps with hair loss. Patient to call back if any questions.

## 2012-06-06 ENCOUNTER — Other Ambulatory Visit: Payer: Self-pay | Admitting: Internal Medicine

## 2012-06-07 ENCOUNTER — Ambulatory Visit: Payer: Medicare Other | Admitting: Internal Medicine

## 2012-06-20 ENCOUNTER — Telehealth: Payer: Self-pay | Admitting: *Deleted

## 2012-06-20 NOTE — Telephone Encounter (Signed)
Olegario Messier, RpH ( [phone])called from [facility] stating pt is requesting levothryoxine due to cost rather than brand name synthroid. OKay to change to generic?

## 2012-06-20 NOTE — Telephone Encounter (Signed)
I called the pharmacy for they prompted MD to change medication indicating generic is not available. Pharmacist indicated the Mylan generic is not available but they do have a Sandos generic available. I discussed with the pharmacist it was not presented like that, they way it is being presented is the Brand name Synthroid is the only other option. Pharmacist indicated he will update patient's account to reflect her getting generic that is available.

## 2012-06-26 ENCOUNTER — Encounter: Payer: Self-pay | Admitting: Lab

## 2012-06-27 ENCOUNTER — Other Ambulatory Visit (INDEPENDENT_AMBULATORY_CARE_PROVIDER_SITE_OTHER): Payer: Medicare Other

## 2012-06-27 DIAGNOSIS — E785 Hyperlipidemia, unspecified: Secondary | ICD-10-CM

## 2012-06-27 DIAGNOSIS — E559 Vitamin D deficiency, unspecified: Secondary | ICD-10-CM

## 2012-06-27 DIAGNOSIS — E0590 Thyrotoxicosis, unspecified without thyrotoxic crisis or storm: Secondary | ICD-10-CM

## 2012-06-27 LAB — TSH: TSH: 3.82 u[IU]/mL (ref 0.35–5.50)

## 2012-06-28 LAB — NMR LIPOPROFILE WITH LIPIDS
Cholesterol, Total: 215 mg/dL — ABNORMAL HIGH (ref <200)
HDL Particle Number: 17.2 umol/L — ABNORMAL LOW (ref >=30.5)
HDL Size: 9.1 nm — ABNORMAL LOW (ref >=9.2)
HDL-C: 33 mg/dL — ABNORMAL LOW (ref >=40)
LDL (calc): 159 mg/dL — ABNORMAL HIGH (ref <100)
LDL Particle Number: 1749 nmol/L — ABNORMAL HIGH (ref <1000)
LDL Size: 21 nm (ref >20.5)
LP-IR Score: 39 (ref <=45)
Large HDL-P: 1.3 umol/L — ABNORMAL LOW (ref >=4.8)
Large VLDL-P: <0.8 nmol/L (ref <=2.7)
Small LDL Particle Number: 644 nmol/L — ABNORMAL HIGH (ref <=527)
Triglycerides: 113 mg/dL (ref <150)
VLDL Size: 46.4 nm (ref <=46.6)

## 2012-06-30 ENCOUNTER — Encounter: Payer: Self-pay | Admitting: Internal Medicine

## 2012-07-01 LAB — VITAMIN D 1,25 DIHYDROXY
Vitamin D 1, 25 (OH)2 Total: 110 pg/mL — ABNORMAL HIGH (ref 18–72)
Vitamin D2 1, 25 (OH)2: 10 pg/mL
Vitamin D3 1, 25 (OH)2: 100 pg/mL

## 2012-07-26 ENCOUNTER — Encounter: Payer: Self-pay | Admitting: Internal Medicine

## 2012-08-07 ENCOUNTER — Telehealth: Payer: Self-pay | Admitting: Internal Medicine

## 2012-08-07 NOTE — Telephone Encounter (Signed)
Plain Mucinex (NOT D) for thick secretions ;force NON dairy fluids .   Fluticasone 1 spray in each nostril twice a day as needed. Use the "crossover" technique into opposite nostril spraying toward opposite ear @ 45 degree angle, not straight up into nostril.  Use a Neti pot daily only  as needed for significant sinus congestion; going from open side to congested side . Plain Allegra (NOT D )  160 daily , Loratidine 10 mg , OR Zyrtec 10 mg @ bedtime  as needed for itchy eyes & sneezing.

## 2012-08-07 NOTE — Telephone Encounter (Signed)
Pt.notified

## 2012-08-07 NOTE — Telephone Encounter (Signed)
Hopp please advise on alternative for patient, last OV 02/2012

## 2012-08-07 NOTE — Telephone Encounter (Signed)
Singular is not working and she would like another kind of allergy rx She is at [phone]

## 2012-08-28 ENCOUNTER — Telehealth: Payer: Self-pay | Admitting: *Deleted

## 2012-08-28 NOTE — Telephone Encounter (Signed)
Pt currently out of town.  Was seen at Urgent Care for right side and back pain this week.  Provider there recommended a CT scan which Kristin Banks did not have completed.  Scheduled appt for 08/29/12, 4:30 pm, Pt requesting referral to Kidney specialist and referral for CT scan and also wants return phone call from clinical.  Best number for pt is [phone].

## 2012-08-28 NOTE — Telephone Encounter (Signed)
Please cancel the 08/29/12 appointment here.  There are no records in the EMR from an urgent care concerning the workup. She will need to contact the urgent care and have them faxed those records to the doctor she sees in [place], [place].  Unfortunately I cannot make referrals without taking history and examining her. This would require an appointment.

## 2012-08-28 NOTE — Telephone Encounter (Signed)
Discuss with patient, Appt cancel

## 2012-08-28 NOTE — Telephone Encounter (Signed)
Called Pt back who indicated that she is still out of town in [place] and does not want to rush to get back for OV tomorrow so she would like to go ahead and cancel appt. Pt notes that she found someone to see her locally tomorrow. Also inform Pt that we will need to get records from when she was seen in UC prior to Dr Alwyn Ren suggesting any additional testing and she still may need to be evaluated by him as well. Pt will have records faxed to our office

## 2012-08-29 ENCOUNTER — Ambulatory Visit: Payer: Medicare Other | Admitting: Internal Medicine

## 2012-09-17 ENCOUNTER — Encounter: Payer: Self-pay | Admitting: Internal Medicine

## 2012-10-26 ENCOUNTER — Other Ambulatory Visit: Payer: Self-pay | Admitting: Internal Medicine

## 2012-11-04 IMAGING — CR DG CHEST 1V PORT
1 series · 1 of 1 positions shown · non-contrast
Comparison: Chest radiograph performed 03/27/2006

CLINICAL DATA: Chest pain, shortness of breath, nausea and
vomiting; diaphoresis.

PORTABLE CHEST - 1 VIEW

[view not recorded]
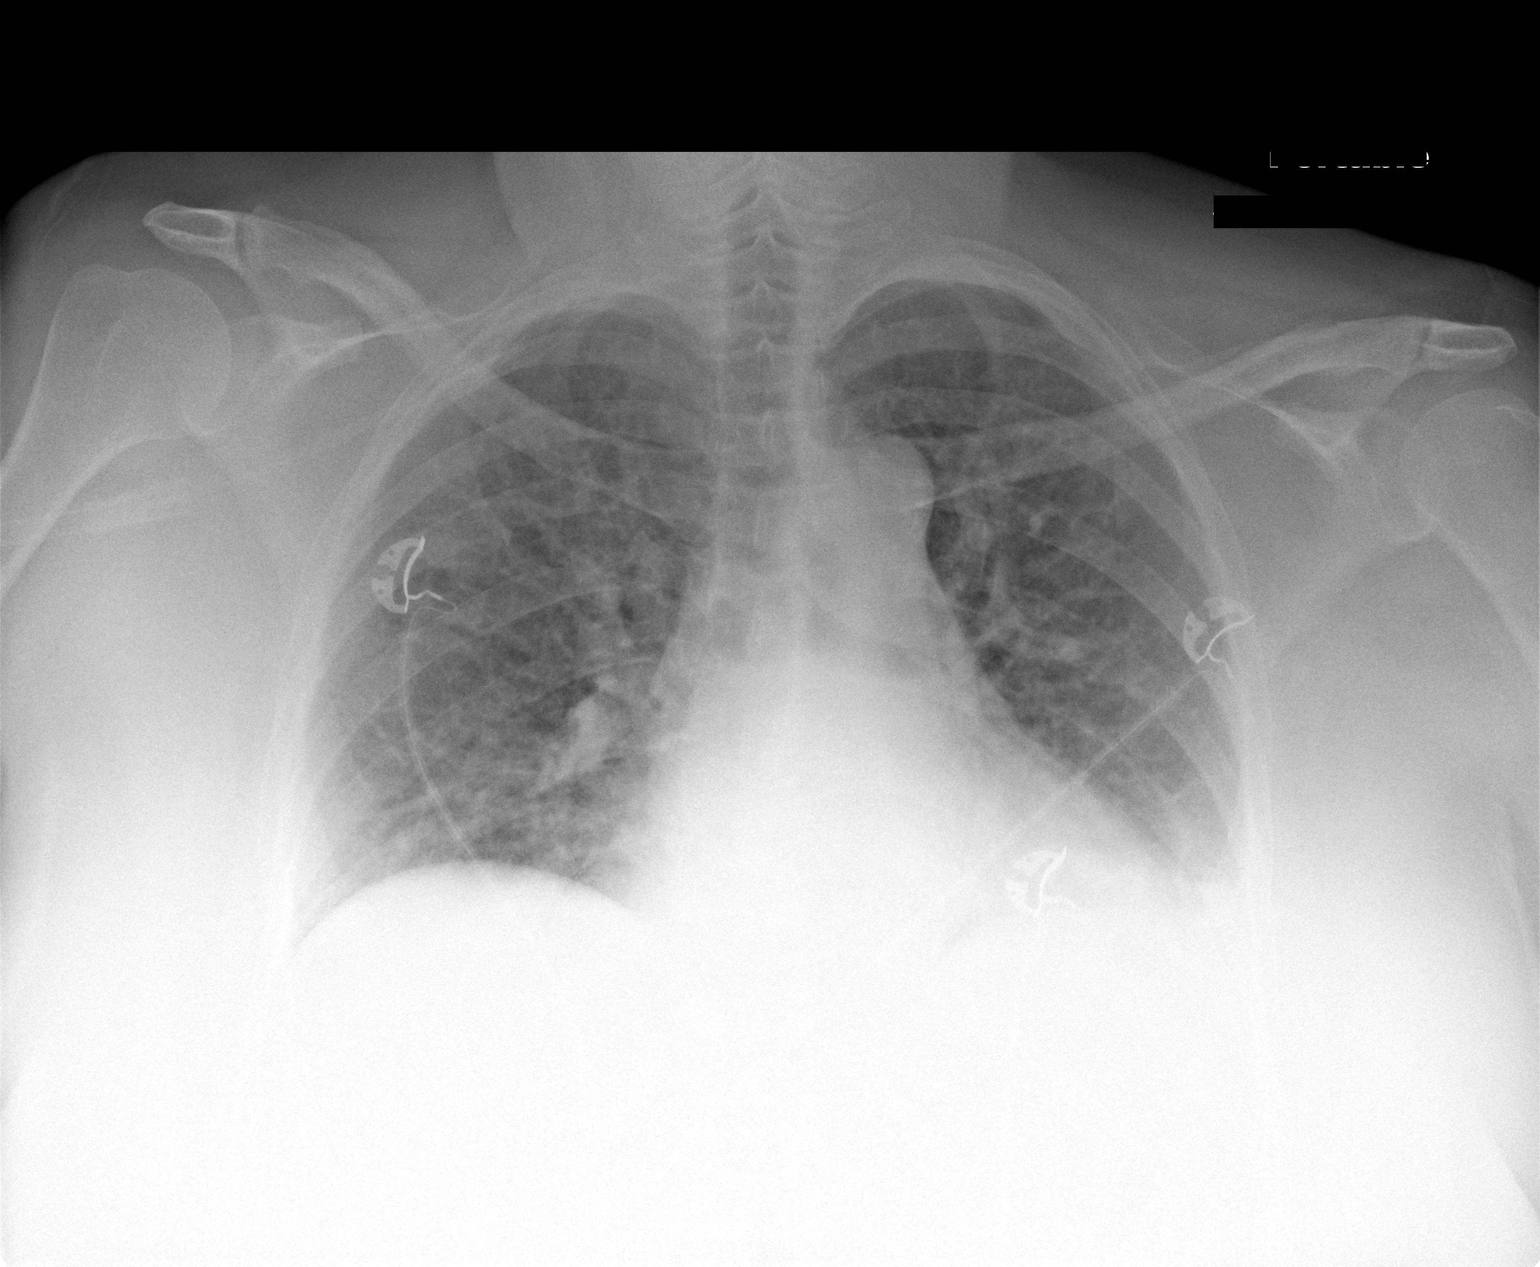

[1 of 1 positions shown; findings below may reference images not displayed]

FINDINGS: The lungs are hypoexpanded.  Vascular congestion is
noted, with bilateral central and left basilar airspace opacity,
likely reflecting pulmonary edema.  Underlying left basilar
pneumonia cannot be excluded.  A small left pleural effusion is
likely present.  No pneumothorax is seen.

The cardiomediastinal silhouette is borderline normal in size.  No
acute osseous abnormalities are seen.
IMPRESSION: 1.  Vascular congestion, with bilateral central and left basilar
airspace opacity, and likely small left pleural effusion,
suspicious for pulmonary edema.
2.  Underlying left basilar pneumonia cannot be excluded; lungs
hypoexpanded.

## 2012-11-20 ENCOUNTER — Other Ambulatory Visit: Payer: Self-pay | Admitting: General Practice

## 2012-11-20 ENCOUNTER — Telehealth: Payer: Self-pay | Admitting: Internal Medicine

## 2012-11-20 MED ORDER — DEXLANSOPRAZOLE 60 MG PO CPDR: DELAYED_RELEASE_CAPSULE | ORAL | Status: DC

## 2012-11-20 NOTE — Telephone Encounter (Signed)
Patient wants to come in to have her thyroid, liver and kidney checked. Please advise.

## 2012-11-22 NOTE — Telephone Encounter (Signed)
LM @ (1:54pm) asking the pt to RTC regarding note about having labs drawn.//AB/CMA

## 2012-11-28 NOTE — Telephone Encounter (Signed)
Spoke with the pt and informed her that she has had recent labs done to check her thyroid, and kidneys.  Informed the pt if we draw more labs she may receive a bill.  Pt stated that's fine and she will wait until she comes in for her PE in Dec, but she would like to have a test done to check her liver b/c she was told she has a spot on her liver and she want to check it to see if anything has changed.  Informed the pt that I can't just order a scan or anything else without the doctor ordering it, and I informed her that Dr. Alwyn Ren is still out of the office.  Pt asked if I would speak with Dr. Alwyn Ren when he comes back and see if he would order a test to check her liver.//AB/CMA

## 2012-11-28 NOTE — Telephone Encounter (Signed)
Lm @ (11:06am) asking the pt to RTC regarding note about having labs drawn.//AB/CMA

## 2012-12-04 NOTE — Telephone Encounter (Signed)
Lm @ (5:31pm) asking the pt to RTC regarding note below about requesting scan on liver.//AB/CMA

## 2012-12-05 NOTE — Telephone Encounter (Signed)
Spoke with the pt and informed her that Dr. Alwyn Ren does want to order any test before she is seen in Dec.  Pt understood and agreed.//AB/CMA

## 2012-12-27 ENCOUNTER — Other Ambulatory Visit: Payer: Self-pay | Admitting: Internal Medicine

## 2012-12-28 ENCOUNTER — Telehealth: Payer: Self-pay | Admitting: *Deleted

## 2012-12-28 ENCOUNTER — Other Ambulatory Visit: Payer: Self-pay | Admitting: *Deleted

## 2012-12-28 MED ORDER — LIDOCAINE 5 % EX PTCH: 1.0000 | MEDICATED_PATCH | CUTANEOUS | Status: DC

## 2012-12-28 NOTE — Telephone Encounter (Signed)
Refill x1 

## 2012-12-28 NOTE — Telephone Encounter (Signed)
90 day supply sent to pharmacy per patient request because it is cheaper

## 2012-12-28 NOTE — Telephone Encounter (Signed)
90 day supply sent to pharmacy

## 2012-12-28 NOTE — Telephone Encounter (Signed)
lidocaine (LIDODERM) 5 % Last OV: 03/06/2012 Last Refill: 10/20/2010 UDS Due: 12/27/2012  Low risk

## 2012-12-28 NOTE — Telephone Encounter (Signed)
Pt is calling upset because she was only given a 30-day supply of her Lidocaine rx and states that she normally gets 90 days because it is cheaper with her prescription plan. She says that she will not pick up rx until it is changed to a 90-day supply. Please advise.

## 2012-12-28 NOTE — Telephone Encounter (Signed)
Med refilled.

## 2013-01-22 ENCOUNTER — Telehealth: Payer: Self-pay | Admitting: Internal Medicine

## 2013-01-22 NOTE — Telephone Encounter (Signed)
Patient called and requested a 90 day supply for Ibuprofen.    Pharmacy [address]

## 2013-01-25 ENCOUNTER — Other Ambulatory Visit: Payer: Self-pay | Admitting: *Deleted

## 2013-01-25 NOTE — Telephone Encounter (Signed)
Nonsteroidal agents such as ibuprofen would be relative contraindicated with her reflux requiring maintenance therapy with ongoing Nexium . The tramadol would be safe on the stomach. Ibuprofen on a regular basis could increase of GI as well as cardiac risk.

## 2013-01-25 NOTE — Telephone Encounter (Signed)
Ok to fill? Not on current or historical med list.

## 2013-01-25 NOTE — Telephone Encounter (Signed)
Patient calling back in regards to request below. Advised patient I will re-send request. Please follow-up.Patient is wanting to be called when request is complete.

## 2013-01-25 NOTE — Telephone Encounter (Signed)
Patient states that ibuprofen works well for her and she would really like this over Tramadol. She states that the ibuprofen doesn't interfere with her reflux.

## 2013-01-25 NOTE — Telephone Encounter (Signed)
Whoever prescribed the ibuprofen can prescribe this; I do not believe that this is compatible with the standard of care as per [facility] medical board . Such medication should be taken as infrequently as possible and @  the lowest possible dose. These risks are greater as we age as there is decreased ability of the liver and kidneys to metabolize and excrete the medication, resulting in increased blood levels of the active ingredient with increased cardiac & GI risks.Marland Kitchen

## 2013-01-26 ENCOUNTER — Encounter: Payer: Self-pay | Admitting: Internal Medicine

## 2013-01-26 NOTE — Telephone Encounter (Signed)
I am sorry but her GERD was relative contraindication to high dose prescription NSAIDS but her history of Barrett's esophagus & gastritis (Dr Reginal Lutes) are absolute contraindications. With knowledge of those 2 conditions; my prescribing maintenance NSAIDS would violate standard of care.

## 2013-01-28 NOTE — Telephone Encounter (Signed)
Spoke with patient. She was not happy with [place] but I advised her why.  She would like to have 90 days of the tramadol. She says that it's much cheaper that way even if she doesn't use it all.  Please advise.

## 2013-01-31 ENCOUNTER — Telehealth: Payer: Self-pay | Admitting: Internal Medicine

## 2013-01-31 ENCOUNTER — Other Ambulatory Visit: Payer: Self-pay | Admitting: *Deleted

## 2013-01-31 MED ORDER — TRAMADOL HCL 50 MG PO TABS: ORAL_TABLET | ORAL | Status: AC

## 2013-01-31 NOTE — Telephone Encounter (Signed)
Tramadol sent to pharmacy

## 2013-01-31 NOTE — Telephone Encounter (Signed)
Patient states that she spoke with someone who stated that a prescription was supposed to be sent to her pharmacy yesterday. She called her pharmacy and states that there is nothing there for her. Please advise. Patient states that she does not remember who she spoke with or what medication would be sent.

## 2013-01-31 NOTE — Telephone Encounter (Signed)
Tramadol refill faxed to pharmacy.  

## 2013-02-02 ENCOUNTER — Other Ambulatory Visit: Payer: Self-pay | Admitting: Internal Medicine

## 2013-02-04 NOTE — Telephone Encounter (Signed)
Montelukast and Dexilant refills sent to pharmacy

## 2013-02-06 ENCOUNTER — Other Ambulatory Visit: Payer: Self-pay

## 2013-02-06 DIAGNOSIS — Z1231 Encounter for screening mammogram for malignant neoplasm of breast: Secondary | ICD-10-CM

## 2013-03-12 ENCOUNTER — Ambulatory Visit
Admission: RE | Admit: 2013-03-12 | Discharge: 2013-03-12 | Disposition: A | Discharge status: Home / Self Care | Payer: Medicare Other | Source: Ambulatory Visit

## 2013-03-12 DIAGNOSIS — Z1231 Encounter for screening mammogram for malignant neoplasm of breast: Secondary | ICD-10-CM

## 2013-03-15 ENCOUNTER — Encounter: Payer: Medicare Other | Admitting: Internal Medicine

## 2013-03-18 ENCOUNTER — Encounter: Payer: Medicare Other | Admitting: Internal Medicine

## 2013-03-18 ENCOUNTER — Telehealth: Payer: Self-pay

## 2013-03-18 NOTE — Telephone Encounter (Signed)
Medication and allergies:  Reviewed and updated  90 day supply/mail order: na Local pharmacy: [facility]   Immunizations due:  Declines flu vaccine  A/P:   No changes to FH or PSH or personal hx Pap--gyn [facility]--02/2013--neg Bone Density-- [facility]--Dr Jodi Marble in [place]  To Discuss with Provider: Refills--Desonate, Lidocaine patch, Singular Drysol/hypercare 20%

## 2013-03-19 ENCOUNTER — Encounter: Payer: Medicare Other | Admitting: Internal Medicine

## 2013-03-19 ENCOUNTER — Telehealth: Payer: Self-pay | Admitting: *Deleted

## 2013-03-19 DIAGNOSIS — Z0289 Encounter for other administrative examinations: Secondary | ICD-10-CM

## 2013-03-19 NOTE — Telephone Encounter (Signed)
Spoke with patient after she came to the office stating her CPE appt was at 9:30 rather than 8:30. Pateint became very irritated and stated this was our fault not hers. Patient did receive the pre-visit phone call that confirmed appt date and time. Office mgr is requesting play back of "robo-call" to confirm appt time to verify correct time was given with the reminder call. Patient then went on raising her voice and stating that " You need to get over yourself, you are in the wrong profession because you are definitely not a people person." Patient then gathered her things and left.   Dismissal letter already printed and process started.

## 2013-03-19 NOTE — Telephone Encounter (Signed)
Pateint called late this morning and cancelled appt. Provider prefers to dismiss patient because she did receive the pre-visit call but did not mention she was considering cancelling CPE appt. Dismissal letter printed and process started.

## 2013-03-20 ENCOUNTER — Telehealth: Payer: Self-pay | Admitting: *Deleted

## 2013-03-20 ENCOUNTER — Telehealth: Payer: Self-pay | Admitting: Internal Medicine

## 2013-03-20 NOTE — Telephone Encounter (Signed)
Attempted to contact patient to make her aware tht appointment time yesterday was at 8:30 not 9:30.Confirmed that phone tree advised of 8:30 appt as well as RN Care Coordinator who made the pre-visit call.

## 2013-03-20 NOTE — Telephone Encounter (Signed)
Patient dismissed from [facility] by Marga Melnick MD , effective March 19, 2013. Dismissal letter sent out by certified / registered mail. DAJ

## 2013-03-23 ENCOUNTER — Other Ambulatory Visit: Payer: Self-pay | Admitting: Family Medicine

## 2013-03-23 DIAGNOSIS — M2550 Pain in unspecified joint: Secondary | ICD-10-CM

## 2013-03-25 ENCOUNTER — Other Ambulatory Visit: Payer: Self-pay | Admitting: *Deleted

## 2013-03-25 DIAGNOSIS — K219 Gastro-esophageal reflux disease without esophagitis: Secondary | ICD-10-CM

## 2013-03-25 DIAGNOSIS — Z9109 Other allergy status, other than to drugs and biological substances: Secondary | ICD-10-CM

## 2013-03-25 MED ORDER — DEXLANSOPRAZOLE 60 MG PO CPDR: DELAYED_RELEASE_CAPSULE | ORAL | Status: DC

## 2013-03-25 MED ORDER — MONTELUKAST SODIUM 10 MG PO TABS: ORAL_TABLET | ORAL | Status: AC

## 2013-03-25 NOTE — Telephone Encounter (Signed)
Refills for 30 days supply of [facility]ular [facility] [place]. Patient has been dismissed from practice and will need to establish with another PCP for additional refills.

## 2013-03-25 NOTE — Telephone Encounter (Signed)
Pecola Lawless, MD             Refill X 1 month only; she needs to establish with new PCP      See note above from Dr. Alwyn Ren regarding refilling medications for this patient.

## 2013-03-25 NOTE — Telephone Encounter (Signed)
Lidoderm patches refilled #30 per Dr. Frederik Pear notes to refill meds for 30 days until pt can establish with a new PCP

## 2013-03-25 NOTE — Telephone Encounter (Signed)
Last seen 03/06/12. Patient has been dismissed on 03/20/13. Please advise     KP

## 2013-03-27 ENCOUNTER — Telehealth: Payer: Self-pay | Admitting: Internal Medicine

## 2013-03-27 NOTE — Telephone Encounter (Signed)
Received signed domestic return receipt verifying delivery of certified letter on March 27, 2013. Article number [id number] DAJ

## 2013-05-09 ENCOUNTER — Telehealth: Payer: Self-pay | Admitting: *Deleted

## 2013-05-09 NOTE — Telephone Encounter (Signed)
Patient called to check on her medical records request that she filled out two weeks ago.

## 2013-05-13 NOTE — Telephone Encounter (Signed)
Received call form Kristin Banks requesting medical records. Authorization not in file, emailed, stayed on the phone with her (19:49) to verify receipt of form and listen to multiple concerns. rmf 05/13/13.

## 2013-05-22 ENCOUNTER — Telehealth: Payer: Self-pay | Admitting: *Deleted

## 2013-05-22 NOTE — Telephone Encounter (Signed)
Called and spoke to [place] regarding her request to be seen by Dr. Brett Fairy for follow up of CPAP therapy.  She has been followed by Dr. Thedore Mins in [place].  After speaking with Dr. Brett Fairy about this patient's request, Dr. Brett Fairy does not want to see her.  The reasons are documented in [facility] and occurred due to issues with her sleep studies and visits last year.  She told us at that time that she did not want to see Dr. Brett Fairy or visit our sleep lab again.  I let her know I would not be able to put her on Dr. Edwena Felty schedule but I could recommend another sleep physician for her that is local if she would like.  She explained she had received another referral today for somewhere, but she would like a copy of her records.  These will be mailed to her home in [place] [facility] and her address was confirmed.

## 2013-06-27 DIAGNOSIS — B351 Tinea unguium: Secondary | ICD-10-CM

## 2013-07-16 ENCOUNTER — Encounter: Payer: Self-pay | Admitting: Neurology

## 2013-09-04 ENCOUNTER — Other Ambulatory Visit: Payer: Self-pay

## 2013-09-04 DIAGNOSIS — Z1231 Encounter for screening mammogram for malignant neoplasm of breast: Secondary | ICD-10-CM

## 2013-12-25 ENCOUNTER — Ambulatory Visit (INDEPENDENT_AMBULATORY_CARE_PROVIDER_SITE_OTHER): Payer: Medicare Other | Admitting: Podiatry

## 2013-12-25 ENCOUNTER — Encounter: Payer: Self-pay | Admitting: Podiatry

## 2013-12-25 ENCOUNTER — Ambulatory Visit: Payer: Self-pay | Admitting: Podiatry

## 2013-12-25 VITALS — BP 137/67 | HR 81 | Resp 16

## 2013-12-25 DIAGNOSIS — M722 Plantar fascial fibromatosis: Secondary | ICD-10-CM

## 2013-12-25 DIAGNOSIS — L851 Acquired keratosis [keratoderma] palmaris et plantaris: Secondary | ICD-10-CM | POA: Diagnosis not present

## 2013-12-25 NOTE — Progress Notes (Signed)
Subjective:     Patient ID: Kristin Banks, female   DOB: 1945/07/20, 68 y.o.   MRN: [id number]  HPI patient presents stating that I have a small spot on my second toe left which becomes irritated and I do get occasional discomfort in my arch with loss of stability occasional   Review of Systems  All other systems reviewed and are negative.      Objective:   Physical Exam  Nursing note and vitals reviewed. Constitutional: She is oriented to person, place, and time.  Cardiovascular: Intact distal pulses.   Musculoskeletal: Normal range of motion.  Neurological: She is oriented to person, place, and time.  Skin: Skin is warm.   neurovascular status intact with muscle strength adequate and range of motion subtalar and midtarsal joint within normal limits. Patient is noted to have a small area of irritation on the lateral side second toe left it's superficial and no discomfort in the plantar arch when palpated currently. Digits are well-perfused moderate diminishment of arch height is noted     Assessment:     Possible small cyst or other benign lesion second toe left and mild fascial symptomatology left    Plan:     H&P reviewed and conditions discussed I did debris off the lesion and I did not note any underlying skin color change or other issues and I do not recommend treatment of the arch and less were to get worse except for good support shoes and over-the-counter insoles. If symptoms persist consider orthotics

## 2014-03-13 ENCOUNTER — Ambulatory Visit
Admission: RE | Admit: 2014-03-13 | Discharge: 2014-03-13 | Disposition: A | Discharge status: Home / Self Care | Payer: Medicare Other | Source: Ambulatory Visit

## 2014-03-13 DIAGNOSIS — Z1231 Encounter for screening mammogram for malignant neoplasm of breast: Secondary | ICD-10-CM

## 2015-01-12 ENCOUNTER — Other Ambulatory Visit: Payer: Self-pay

## 2015-01-12 DIAGNOSIS — Z1231 Encounter for screening mammogram for malignant neoplasm of breast: Secondary | ICD-10-CM

## 2015-03-16 ENCOUNTER — Ambulatory Visit
Admission: RE | Admit: 2015-03-16 | Discharge: 2015-03-16 | Disposition: A | Discharge status: Home / Self Care | Payer: Medicare Other | Source: Ambulatory Visit

## 2015-03-16 DIAGNOSIS — Z1231 Encounter for screening mammogram for malignant neoplasm of breast: Secondary | ICD-10-CM

## 2015-03-24 ENCOUNTER — Ambulatory Visit (INDEPENDENT_AMBULATORY_CARE_PROVIDER_SITE_OTHER): Payer: Medicare Other | Admitting: Podiatry

## 2015-03-24 DIAGNOSIS — L851 Acquired keratosis [keratoderma] palmaris et plantaris: Secondary | ICD-10-CM

## 2015-03-24 NOTE — Patient Instructions (Signed)
Today there is a small area of tenderness on the lateral border of the second left toe present for a year that you described taking with a pin. I'm referring you to Dr. Earleen Newport for further evaluation and possible excisional and or biopsy of this lesion as you mutually agree upon
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
## 2015-06-10 ENCOUNTER — Encounter: Payer: Self-pay | Admitting: Podiatry

## 2015-06-10 ENCOUNTER — Ambulatory Visit (INDEPENDENT_AMBULATORY_CARE_PROVIDER_SITE_OTHER): Payer: Medicare Other | Admitting: Podiatry

## 2015-06-10 VITALS — BP 134/70 | HR 70 | Ht 62.0 in | Wt 218.0 lb

## 2015-06-10 DIAGNOSIS — L923 Foreign body granuloma of the skin and subcutaneous tissue: Secondary | ICD-10-CM | POA: Diagnosis not present

## 2015-06-10 DIAGNOSIS — L851 Acquired keratosis [keratoderma] palmaris et plantaris: Secondary | ICD-10-CM

## 2015-06-10 DIAGNOSIS — M79605 Pain in left leg: Secondary | ICD-10-CM | POA: Diagnosis not present

## 2015-06-10 NOTE — Progress Notes (Signed)
   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   SUBJECTIVE: 70 y.o. year old female presents stating that she was in [facility] last year in July 2016 for a little growth on 2nd toe left foot. Patient came here because the doctor she saw did not remove the lesion.  Patient is referred by Dr. Ella Jubilee.   REVIEW OF SYSTEMS: A comprehensive review of systems was negative except for: Thyroid, Reflux, and Knee arthritis problem.   OBJECTIVE: DERMATOLOGIC EXAMINATION: Nails: Mild fungal infection on both great toes.  Small pin point dark spot at lateral aspect of 2nd digit left foot that is bothering her and feels like something is sticking out.   VASCULAR EXAMINATION OF LOWER LIMBS: Pedal pulses: All pedal pulses are palpable with normal pulsation.  Temperature gradient from tibial crest to dorsum of foot is within normal bilateral.  NEUROLOGIC EXAMINATION OF THE LOWER LIMBS: Achilles DTR is present and within normal. Monofilament (Semmes-Weinstein 10-gm) sensory testing positive 6 out of 6, bilateral. Vibratory sensations(128Hz  turning fork) intact at medial and lateral forefoot bilateral.  Sharp and Dull discriminatory sensations at the plantar ball of hallux is intact bilateral.   MUSCULOSKELETAL EXAMINATION: Normal findings.   ASSESSMENT: Porokeratotic lesion 2nd digit right. Possible foreign body 2nd digit right.  PLAN: Area debrided and removed keratotic layer. Rx for compound antifungal drop for toe nail.  Return as needed.

## 2015-06-10 NOTE — Patient Instructions (Signed)
Seen for a lesion on 2nd toe left. Area debrided. Rx for antifungal medication for fungal nails. Return as needed.

## 2015-06-11 ENCOUNTER — Telehealth: Payer: Self-pay | Admitting: *Deleted

## 2015-06-11 MED ORDER — CICLOPIROX 8 % EX SOLN: Freq: Every day | CUTANEOUS | Status: AC

## 2015-06-11 NOTE — Telephone Encounter (Signed)
06/11/15 Dr.Sheard, Patient called this am and states she doesn't want the mail in Rx, can you prescribe something else please?

## 2015-06-16 ENCOUNTER — Ambulatory Visit: Payer: Medicare Other | Admitting: Podiatry

## 2015-11-20 DIAGNOSIS — S6701XA Crushing injury of right thumb, initial encounter: Secondary | ICD-10-CM | POA: Insufficient documentation

## 2016-01-07 DIAGNOSIS — I10 Essential (primary) hypertension: Secondary | ICD-10-CM | POA: Insufficient documentation

## 2016-01-07 DIAGNOSIS — R072 Precordial pain: Secondary | ICD-10-CM | POA: Insufficient documentation

## 2016-03-08 ENCOUNTER — Ambulatory Visit (INDEPENDENT_AMBULATORY_CARE_PROVIDER_SITE_OTHER): Payer: Medicare Other | Admitting: Sports Medicine

## 2016-03-08 ENCOUNTER — Encounter: Payer: Self-pay | Admitting: Sports Medicine

## 2016-03-08 ENCOUNTER — Ambulatory Visit (INDEPENDENT_AMBULATORY_CARE_PROVIDER_SITE_OTHER): Payer: Medicare Other

## 2016-03-08 DIAGNOSIS — L989 Disorder of the skin and subcutaneous tissue, unspecified: Secondary | ICD-10-CM

## 2016-03-08 DIAGNOSIS — L603 Nail dystrophy: Secondary | ICD-10-CM | POA: Diagnosis not present

## 2016-03-08 DIAGNOSIS — M779 Enthesopathy, unspecified: Secondary | ICD-10-CM

## 2016-03-08 DIAGNOSIS — Q828 Other specified congenital malformations of skin: Secondary | ICD-10-CM

## 2016-03-08 DIAGNOSIS — M79674 Pain in right toe(s): Secondary | ICD-10-CM

## 2016-03-08 DIAGNOSIS — M79675 Pain in left toe(s): Secondary | ICD-10-CM | POA: Diagnosis not present

## 2016-03-08 NOTE — Progress Notes (Signed)
Subjective: Kristin Banks is a 70 y.o. female patient seen today in office with complaint of both 3rd toenails coming off after sleep study and a non-painful lesion that feels like a little splinter at the left 2nd toe, present over 1-2 years was trimmed and removed but keeps coming back. Patient denies history of Diabetes, Neuropathy, or Vascular disease. Patient has no other pedal complaints at this time.   Patient Active Problem List   Diagnosis Date Noted  . Sleep apnea 03/06/2012  . PTOSIS 05/20/2010  . FACIAL PARESTHESIA, LEFT 05/20/2010  . MICROSCOPIC HEMATURIA 03/19/2010  . VITAMIN D DEFICIENCY 10/21/2009  . ANEMIA 10/21/2009  . ARTHRALGIA 10/16/2009  . 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 POSTMENOPAUSAL SYNDROME 07/08/2009  . HYPOTHYROIDISM 12/12/2008  . HYPERLIPIDEMIA 12/12/2008  . ALLERGIC RHINITIS 12/12/2008  . GERD 12/12/2008  . BARRETTS ESOPHAGUS 12/12/2008  . CHOLELITHIASIS 12/12/2008  . COLONIC POLYPS, HX OF 12/12/2008    Current Outpatient Prescriptions on File Prior to Visit  Medication Sig Dispense Refill  . aspirin 81 MG tablet Take 81 mg by mouth daily. Reported on 06/10/2015    . Cholecalciferol (VITAMIN D3) 1000 UNITS CAPS Take 1,000 Int'l Units by mouth. 1 by mouth daily    . ciclopirox (PENLAC) 8 % solution Apply topically at bedtime. Apply over nail and surrounding skin. Apply daily over previous coat. After seven (7) days, may remove with alcohol and continue cycle. 8 mL 0  . desonide (DESONATE) 0.05 % gel Apply topically 2 (two) times daily as needed. Reported on 06/10/2015    . levothyroxine (SYNTHROID, LEVOTHROID) 112 MCG tablet TAKE ONE TABLET BY MOUTH EVERY DAY EXCEPT ONE-HALF TABLET ON TUESDAYS,THURSDAYS AND SATURDAYS 90 tablet 1  . montelukast (SINGULAIR) 10 MG tablet TAKE 1 TABLET EVERY DAY AS NEEDED (Patient not taking: Reported on 06/10/2015) 30 tablet 0  . Multiple Vitamins-Minerals (CENTRUM ULTRA WOMENS) TABS Take by mouth.      Marland Kitchen NEXIUM 40 MG  capsule TAKE 1 CAPSULE BY MOUTH TWICE A DAY 30 MINUTES BEFORE MEAL 180 capsule 2  . Omega-3 Fatty Acids (FISH OIL) 1000 MG CAPS Take 1,000 mg by mouth daily.      . traMADol (ULTRAM) 50 MG tablet 1/2-1 by mouth every 6 hours as needed for joint pain (Patient not taking: Reported on 06/10/2015) 90 tablet 0   No current facility-administered medications on file prior to visit.     No Known Allergies  Objective: Physical Exam  General: Well developed, nourished, no acute distress, awake, alert and oriented x 3  Vascular: Dorsalis pedis artery 2/4 bilateral, Posterior tibial artery 1/4 bilateral, skin temperature warm to warm proximal to distal bilateral lower extremities, no varicosities, pedal hair present bilateral.  Neurological: Gross sensation present via light touch bilateral.   Dermatological: Skin is warm, dry, and supple bilateral, Nails 1-10 are short, thick, and discolored with mild subungal debris and trauma line present bilateral 3rd toes with no lifting, per patient previous lifting nail has came off with new nail present underneath, no webspace macerations present bilateral, no open lesions present bilateral, At lateral 2nd toe on left there is a small pinpoint hyperpigmented lesion, non painful, no pusultile likely skin tag, + hyperkeratotic tissue present left sub met 5, No signs of infection bilateral.  Musculoskeletal: Asymptomatic hammertoe boney deformities noted bilateral. Muscular strength within normal limits without painon range of motion. No pain with calf compression bilateral.  Xrays, Left foot- no acute findings at left 2nd toe within soft tissues  Assessment and Plan:  Problem List Items Addressed This Visit    None    Visit Diagnoses    Porokeratosis    -  Primary   Capsulitis       Relevant Orders   DG Foot Complete Left   Skin lesion       Nail dystrophy       Toe pain, bilateral         -Examined patient.  -Xrays reviewed  -Discussed treatment  options for non painful hyperpigmented lesion left 2nd toe. Recommend surgical excision. Patient declines at this time and will closely monitor. I advised patient to refrain from self trimming to prevent infection -Mechanically debrided keratosis sub met 5 on left using sterile chisel blade without incident. -Recommend good supportive shoes and toe protectors and advised patient to closely monitor nails likely nails fell off secondary to microtrauma -Patient to return as needed for follow up evaluation or sooner if symptoms worsen.  Landis Martins, DPM

## 2016-11-25 DIAGNOSIS — M67432 Ganglion, left wrist: Secondary | ICD-10-CM | POA: Insufficient documentation

## 2017-01-19 ENCOUNTER — Other Ambulatory Visit: Payer: Self-pay | Admitting: Obstetrics and Gynecology

## 2017-01-19 DIAGNOSIS — Z1231 Encounter for screening mammogram for malignant neoplasm of breast: Secondary | ICD-10-CM

## 2017-03-03 ENCOUNTER — Ambulatory Visit
Admission: RE | Admit: 2017-03-03 | Discharge: 2017-03-03 | Disposition: A | Discharge status: Home / Self Care | Payer: Medicare Other | Source: Ambulatory Visit | Attending: Obstetrics and Gynecology | Admitting: Obstetrics and Gynecology

## 2017-03-03 DIAGNOSIS — Z1231 Encounter for screening mammogram for malignant neoplasm of breast: Secondary | ICD-10-CM

## 2017-03-16 ENCOUNTER — Other Ambulatory Visit: Payer: Self-pay | Admitting: Gastroenterology

## 2017-03-16 DIAGNOSIS — R1011 Right upper quadrant pain: Secondary | ICD-10-CM

## 2017-03-17 ENCOUNTER — Ambulatory Visit: Payer: Medicare Other

## 2017-03-23 ENCOUNTER — Ambulatory Visit
Admission: RE | Admit: 2017-03-23 | Discharge: 2017-03-23 | Disposition: A | Discharge status: Home / Self Care | Payer: Medicare Other | Source: Ambulatory Visit | Attending: Gastroenterology | Admitting: Gastroenterology

## 2017-03-23 DIAGNOSIS — R1011 Right upper quadrant pain: Secondary | ICD-10-CM

## 2017-03-24 DIAGNOSIS — K1379 Other lesions of oral mucosa: Secondary | ICD-10-CM | POA: Insufficient documentation

## 2017-03-25 ENCOUNTER — Other Ambulatory Visit: Payer: Self-pay | Admitting: Gastroenterology

## 2017-03-25 DIAGNOSIS — R932 Abnormal findings on diagnostic imaging of liver and biliary tract: Secondary | ICD-10-CM

## 2017-04-03 ENCOUNTER — Other Ambulatory Visit: Payer: Medicare Other

## 2017-04-07 ENCOUNTER — Ambulatory Visit
Admission: RE | Admit: 2017-04-07 | Discharge: 2017-04-07 | Disposition: A | Discharge status: Home / Self Care | Payer: Medicare Other | Source: Ambulatory Visit | Attending: Gastroenterology | Admitting: Gastroenterology

## 2017-04-07 DIAGNOSIS — R932 Abnormal findings on diagnostic imaging of liver and biliary tract: Secondary | ICD-10-CM

## 2017-04-07 MED ORDER — GADOBENATE DIMEGLUMINE 529 MG/ML IV SOLN
20.0000 mL | Freq: Once | INTRAVENOUS | Status: AC | PRN
  Administered 2017-04-07: 20 mL via INTRAVENOUS

## 2017-04-25 ENCOUNTER — Other Ambulatory Visit: Payer: Self-pay | Admitting: Gastroenterology

## 2017-04-25 DIAGNOSIS — R1011 Right upper quadrant pain: Secondary | ICD-10-CM

## 2017-04-25 DIAGNOSIS — K862 Cyst of pancreas: Secondary | ICD-10-CM

## 2017-04-25 DIAGNOSIS — K7689 Other specified diseases of liver: Secondary | ICD-10-CM

## 2017-10-09 ENCOUNTER — Other Ambulatory Visit: Payer: Medicare Other

## 2017-10-18 ENCOUNTER — Inpatient Hospital Stay: Admission: RE | Admit: 2017-10-18 | Payer: Medicare Other | Source: Ambulatory Visit

## 2017-11-16 ENCOUNTER — Ambulatory Visit
Admission: RE | Admit: 2017-11-16 | Discharge: 2017-11-16 | Disposition: A | Discharge status: Home / Self Care | Payer: Medicare Other | Source: Ambulatory Visit | Attending: Gastroenterology | Admitting: Gastroenterology

## 2017-11-16 DIAGNOSIS — R1011 Right upper quadrant pain: Secondary | ICD-10-CM

## 2017-11-16 DIAGNOSIS — K862 Cyst of pancreas: Secondary | ICD-10-CM

## 2017-11-16 DIAGNOSIS — K7689 Other specified diseases of liver: Secondary | ICD-10-CM

## 2017-11-16 MED ORDER — GADOBENATE DIMEGLUMINE 529 MG/ML IV SOLN
20.0000 mL | Freq: Once | INTRAVENOUS | Status: AC | PRN
  Administered 2017-11-16: 20 mL via INTRAVENOUS

## 2017-12-13 ENCOUNTER — Other Ambulatory Visit: Payer: Self-pay | Admitting: Obstetrics and Gynecology

## 2017-12-13 DIAGNOSIS — Z1231 Encounter for screening mammogram for malignant neoplasm of breast: Secondary | ICD-10-CM

## 2018-03-06 ENCOUNTER — Ambulatory Visit
Admission: RE | Admit: 2018-03-06 | Discharge: 2018-03-06 | Disposition: A | Discharge status: Home / Self Care | Payer: Medicare Other | Source: Ambulatory Visit | Attending: Obstetrics and Gynecology | Admitting: Obstetrics and Gynecology

## 2018-03-06 DIAGNOSIS — Z1231 Encounter for screening mammogram for malignant neoplasm of breast: Secondary | ICD-10-CM

## 2018-03-13 ENCOUNTER — Encounter: Payer: Self-pay | Admitting: Sports Medicine

## 2018-03-13 ENCOUNTER — Ambulatory Visit (INDEPENDENT_AMBULATORY_CARE_PROVIDER_SITE_OTHER): Payer: Medicare Other | Admitting: Sports Medicine

## 2018-03-13 ENCOUNTER — Other Ambulatory Visit: Payer: Self-pay | Admitting: Sports Medicine

## 2018-03-13 ENCOUNTER — Ambulatory Visit (INDEPENDENT_AMBULATORY_CARE_PROVIDER_SITE_OTHER): Payer: Medicare Other

## 2018-03-13 DIAGNOSIS — M779 Enthesopathy, unspecified: Secondary | ICD-10-CM

## 2018-03-13 DIAGNOSIS — M79672 Pain in left foot: Secondary | ICD-10-CM

## 2018-03-13 DIAGNOSIS — M7752 Other enthesopathy of left foot: Secondary | ICD-10-CM

## 2018-03-13 DIAGNOSIS — R6882 Decreased libido: Secondary | ICD-10-CM | POA: Insufficient documentation

## 2018-03-13 DIAGNOSIS — L603 Nail dystrophy: Secondary | ICD-10-CM | POA: Diagnosis not present

## 2018-03-13 DIAGNOSIS — D3610 Benign neoplasm of peripheral nerves and autonomic nervous system, unspecified: Secondary | ICD-10-CM | POA: Diagnosis not present
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
## 2018-05-11 ENCOUNTER — Other Ambulatory Visit: Payer: Self-pay | Admitting: Internal Medicine

## 2018-05-11 DIAGNOSIS — E2839 Other primary ovarian failure: Secondary | ICD-10-CM

## 2018-05-11 DIAGNOSIS — Z1231 Encounter for screening mammogram for malignant neoplasm of breast: Secondary | ICD-10-CM

## 2018-05-14 ENCOUNTER — Other Ambulatory Visit: Payer: Self-pay | Admitting: Gastroenterology

## 2018-05-14 DIAGNOSIS — K869 Disease of pancreas, unspecified: Secondary | ICD-10-CM

## 2018-05-14 DIAGNOSIS — K7689 Other specified diseases of liver: Secondary | ICD-10-CM

## 2018-05-24 ENCOUNTER — Other Ambulatory Visit: Payer: Medicare Other

## 2018-06-05 ENCOUNTER — Ambulatory Visit
Admission: RE | Admit: 2018-06-05 | Discharge: 2018-06-05 | Disposition: A | Discharge status: Home / Self Care | Payer: Medicare Other | Source: Ambulatory Visit | Attending: Gastroenterology | Admitting: Gastroenterology

## 2018-06-05 DIAGNOSIS — K7689 Other specified diseases of liver: Secondary | ICD-10-CM

## 2018-06-05 DIAGNOSIS — K869 Disease of pancreas, unspecified: Secondary | ICD-10-CM

## 2018-06-05 MED ORDER — GADOBENATE DIMEGLUMINE 529 MG/ML IV SOLN
20.0000 mL | Freq: Once | INTRAVENOUS | Status: AC | PRN
  Administered 2018-06-05: 20 mL via INTRAVENOUS

## 2018-07-16 ENCOUNTER — Other Ambulatory Visit: Payer: Medicare Other

## 2018-09-07 ENCOUNTER — Other Ambulatory Visit: Payer: Medicare Other

## 2018-12-26 ENCOUNTER — Other Ambulatory Visit: Payer: Self-pay | Admitting: Obstetrics and Gynecology

## 2018-12-26 DIAGNOSIS — Z1231 Encounter for screening mammogram for malignant neoplasm of breast: Secondary | ICD-10-CM

## 2019-03-08 ENCOUNTER — Ambulatory Visit
Admission: RE | Admit: 2019-03-08 | Discharge: 2019-03-08 | Disposition: A | Discharge status: Home / Self Care | Payer: Medicare Other | Source: Ambulatory Visit | Attending: Obstetrics and Gynecology | Admitting: Obstetrics and Gynecology

## 2019-03-08 ENCOUNTER — Other Ambulatory Visit: Payer: Self-pay

## 2019-03-08 DIAGNOSIS — Z1231 Encounter for screening mammogram for malignant neoplasm of breast: Secondary | ICD-10-CM

## 2019-07-05 ENCOUNTER — Other Ambulatory Visit: Payer: Self-pay | Admitting: Gastroenterology

## 2019-07-05 DIAGNOSIS — K7689 Other specified diseases of liver: Secondary | ICD-10-CM

## 2019-07-05 DIAGNOSIS — K869 Disease of pancreas, unspecified: Secondary | ICD-10-CM

## 2019-07-05 DIAGNOSIS — K862 Cyst of pancreas: Secondary | ICD-10-CM

## 2019-08-12 ENCOUNTER — Ambulatory Visit
Admission: RE | Admit: 2019-08-12 | Discharge: 2019-08-12 | Disposition: A | Discharge status: Home / Self Care | Payer: Medicare Other | Source: Ambulatory Visit | Attending: Gastroenterology | Admitting: Gastroenterology

## 2019-08-12 ENCOUNTER — Other Ambulatory Visit: Payer: Medicare Other

## 2019-08-12 ENCOUNTER — Other Ambulatory Visit: Payer: Self-pay

## 2019-08-12 DIAGNOSIS — K869 Disease of pancreas, unspecified: Secondary | ICD-10-CM

## 2019-08-12 DIAGNOSIS — K862 Cyst of pancreas: Secondary | ICD-10-CM

## 2019-08-12 DIAGNOSIS — K7689 Other specified diseases of liver: Secondary | ICD-10-CM

## 2019-08-12 MED ORDER — GADOBENATE DIMEGLUMINE 529 MG/ML IV SOLN
20.0000 mL | Freq: Once | INTRAVENOUS | Status: AC | PRN
  Administered 2019-08-12: 20 mL via INTRAVENOUS

## 2020-01-15 ENCOUNTER — Other Ambulatory Visit: Payer: Self-pay | Admitting: Obstetrics and Gynecology

## 2020-01-15 DIAGNOSIS — Z1231 Encounter for screening mammogram for malignant neoplasm of breast: Secondary | ICD-10-CM

## 2020-01-30 IMAGING — MG DIGITAL SCREENING BILATERAL MAMMOGRAM WITH TOMO AND CAD
8 series · 8 of 24 positions shown · non-contrast
Comparison: Previous exam(s).

CLINICAL DATA: Screening.

EXAM:
DIGITAL SCREENING BILATERAL MAMMOGRAM WITH TOMO AND CAD

[R MLO synth-2D]
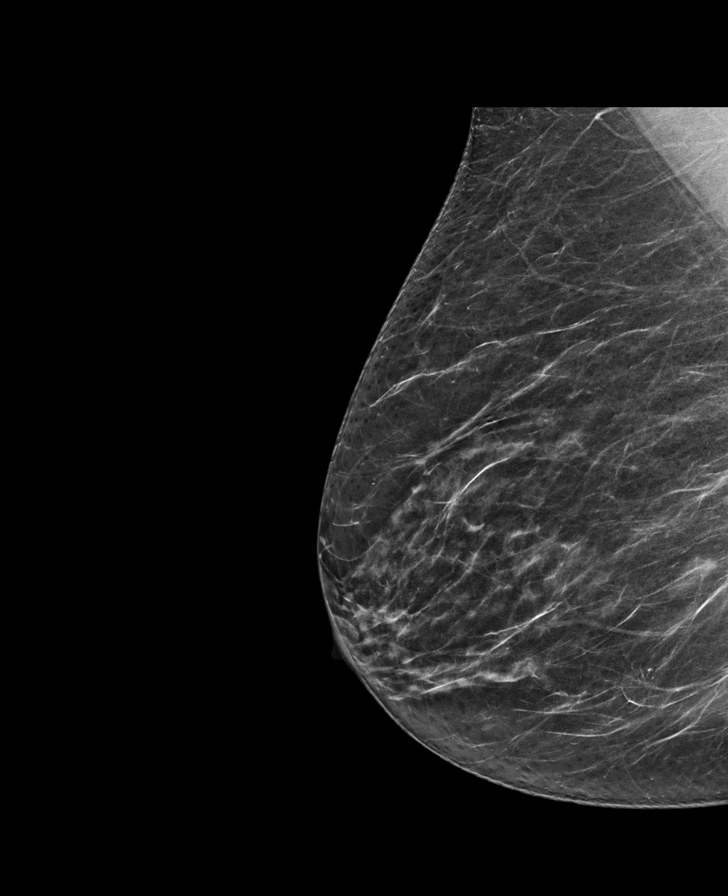

[R CC synth-2D]
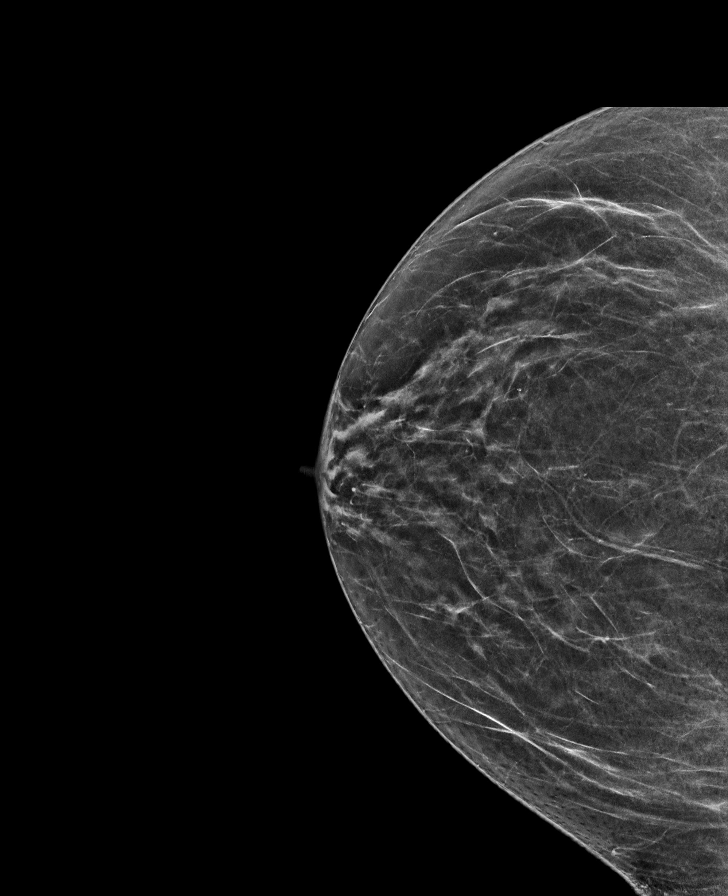

[L MLO synth-2D]
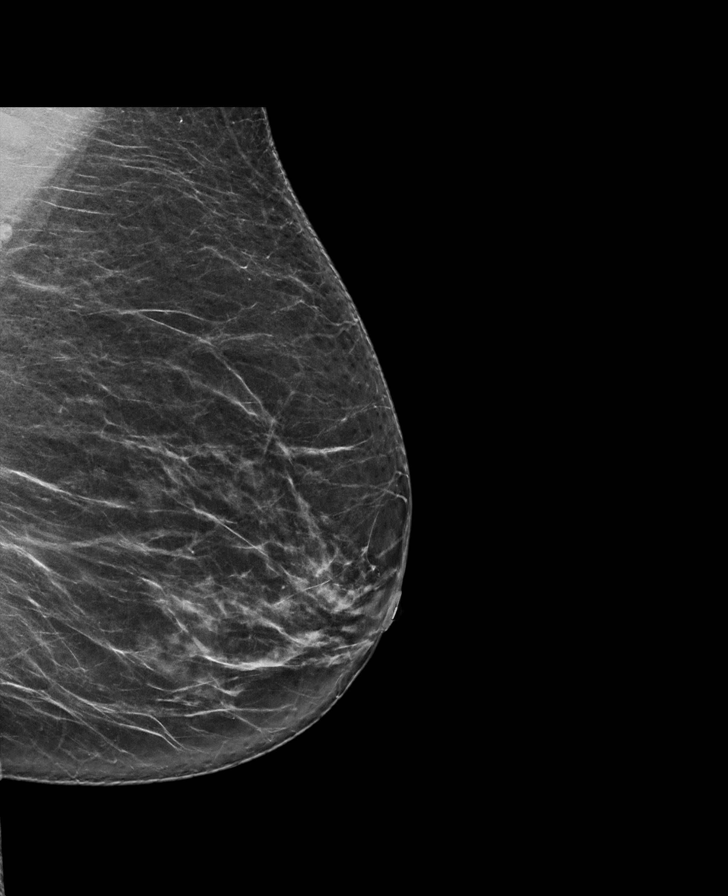

[L CC synth-2D]
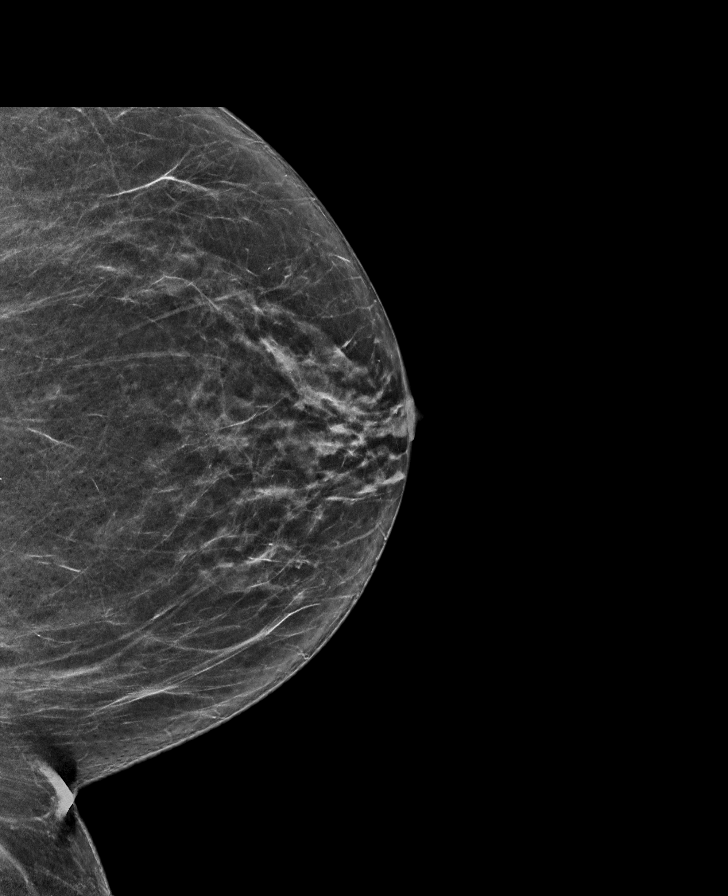

[L CC tomo · tomo slice 35/68.0]
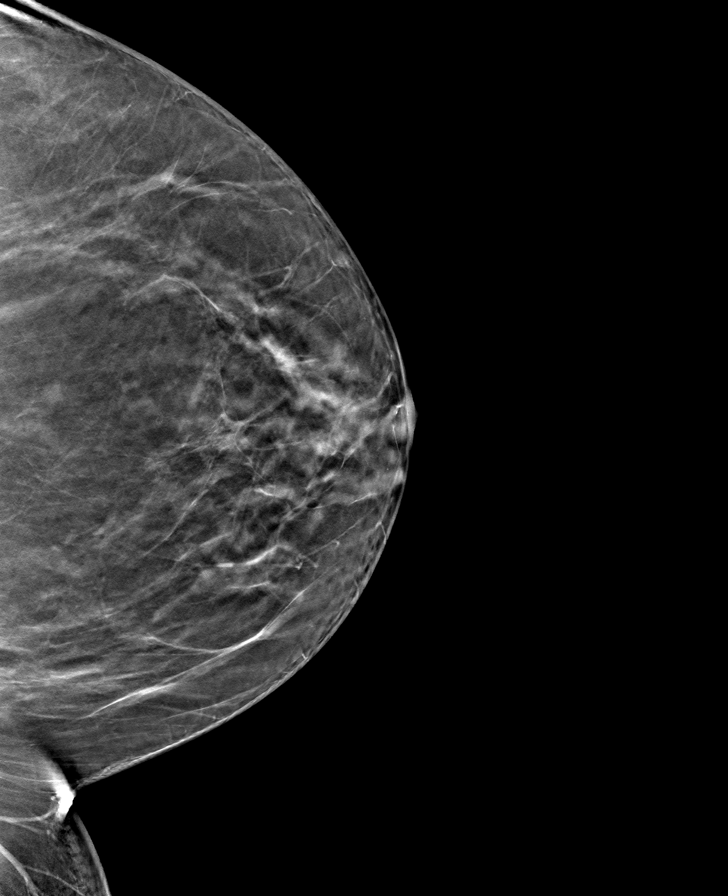

[R MLO tomo · tomo slice 35/70.0]
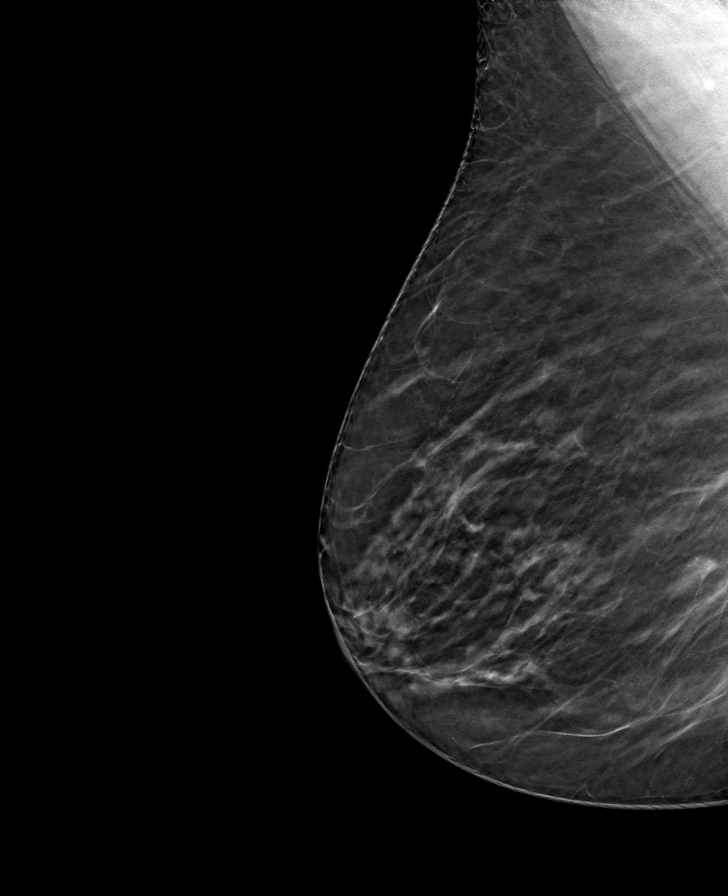

[R CC tomo · tomo slice 33/65.0]
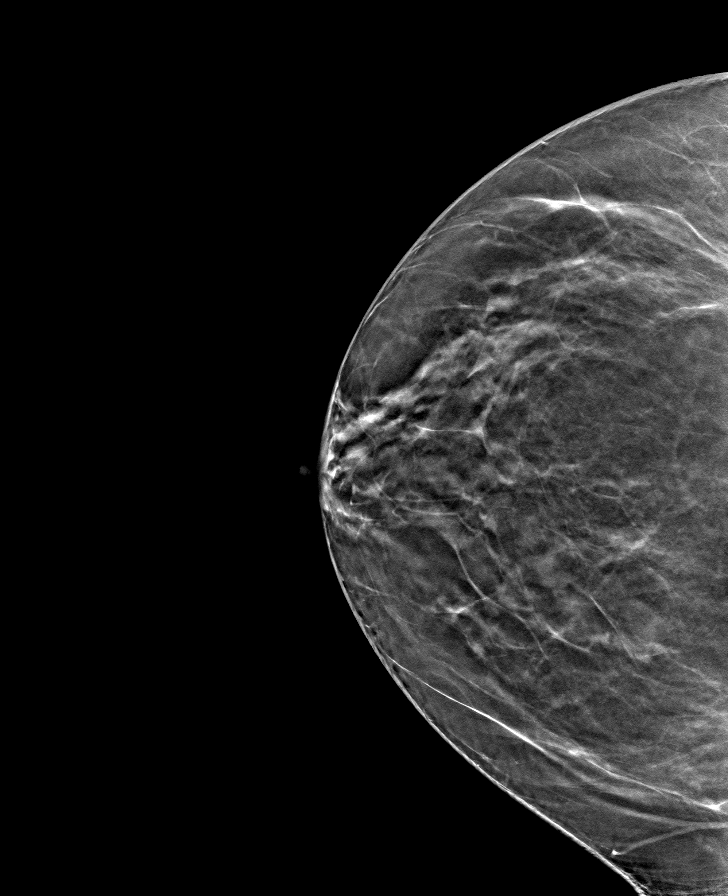

[L MLO tomo · tomo slice 38/75.0]
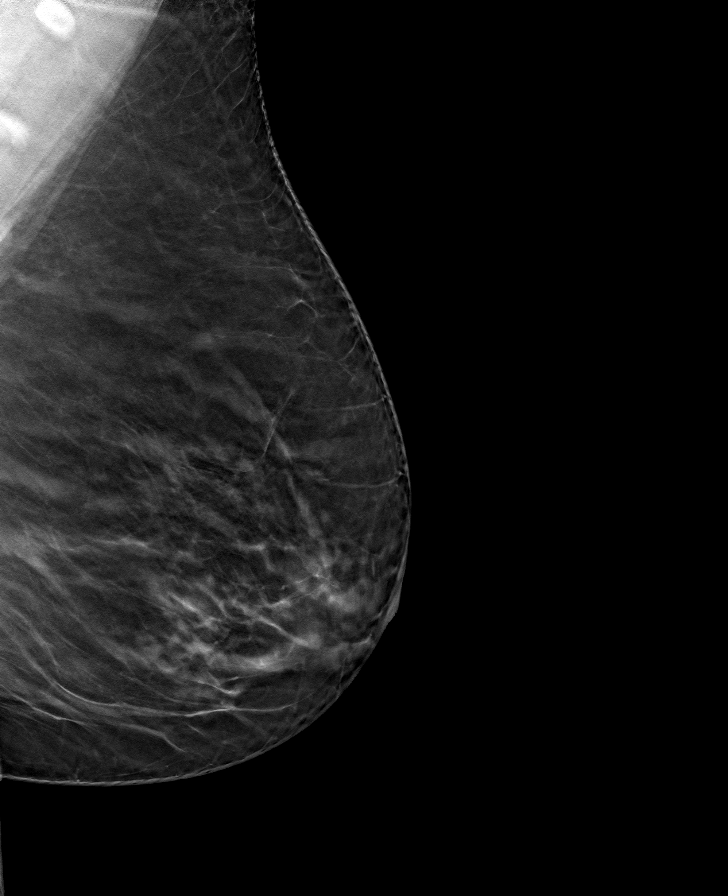

[8 of 24 positions shown; findings below may reference images not displayed]

ACR Breast Density Category b: There are scattered areas of
fibroglandular density.
FINDINGS: There are no findings suspicious for malignancy. Images were
processed with CAD.
IMPRESSION: No mammographic evidence of malignancy. A result letter of this
screening mammogram will be mailed directly to the patient.

RECOMMENDATION:
Screening mammogram in one year. (Code:[id number])

BI-RADS CATEGORY  1: Negative.

## 2020-02-10 ENCOUNTER — Other Ambulatory Visit: Payer: Self-pay

## 2020-02-10 ENCOUNTER — Ambulatory Visit (INDEPENDENT_AMBULATORY_CARE_PROVIDER_SITE_OTHER): Payer: Medicare Other | Admitting: Podiatry

## 2020-02-10 DIAGNOSIS — L989 Disorder of the skin and subcutaneous tissue, unspecified: Secondary | ICD-10-CM | POA: Diagnosis not present

## 2020-02-10 DIAGNOSIS — L600 Ingrowing nail: Secondary | ICD-10-CM | POA: Diagnosis not present

## 2020-02-10 NOTE — Progress Notes (Signed)
   HPI: 74 y.o. female presenting today for evaluation of possible ingrown toenails to the bilateral great toes.  She states that she clipped them herself however she has had some slight sensitivity that is improved over the past week.  She denies any erythema or purulent drainage.  She also complains of a symptomatic callus to the left foot plantar aspect.  Past Medical History:  Diagnosis Date  . Allergy    rhinitis  . Cholelithiasis    simple hepatic cyst as per [place] 01/2006  . Colon polyps    PMH of X 4 since 1989,adenomatous& hyperplastic  . GERD (gastroesophageal reflux disease)    Barretts esophagus;pmh of,h pylori gastritis treated 2008  . Hyperlipidemia    LDLgoal=<90(ideally)based on NMR Lipoprofile 03/06/2008  . Thyroid disease    hypothyroidism  . Vitamin D deficiency      Physical Exam: General: The patient is alert and oriented x3 in no acute distress.  Dermatology: Skin is warm, dry and supple bilateral lower extremities. Negative for open lesions or macerations.  No evidence of an ingrowing nail noted to the bilateral great toes and there is negative sensitivity today with palpation.  There is a hyperkeratotic preulcerative callus tissue noted to the subfifth MTPJ of the left foot.  Vascular: Palpable pedal pulses bilaterally. No edema or erythema noted. Capillary refill within normal limits.  Neurological: Epicritic and protective threshold grossly intact bilaterally.   Musculoskeletal Exam: Range of motion within normal limits to all pedal and ankle joints bilateral. Muscle strength 5/5 in all groups bilateral.   Assessment: 1.  Porokeratosis left foot   Plan of Care:  1. Patient evaluated.  2.  Excisional debridement of the porokeratosis was performed using a chisel blade without incident or bleeding 3.  Recommend good foot hygiene and not trimming the nails too short 4.  Return to clinic as needed      Edrick Kins, DPM [facility]  Dr.  Edrick Kins, DPM    [address], [place] [address]                Office [phone]  Fax [phone]

## 2020-03-09 ENCOUNTER — Other Ambulatory Visit: Payer: Self-pay | Admitting: Internal Medicine

## 2020-03-09 ENCOUNTER — Ambulatory Visit
Admission: RE | Admit: 2020-03-09 | Discharge: 2020-03-09 | Disposition: A | Discharge status: Home / Self Care | Payer: Medicare Other | Source: Ambulatory Visit | Attending: Obstetrics and Gynecology | Admitting: Obstetrics and Gynecology

## 2020-03-09 ENCOUNTER — Other Ambulatory Visit: Payer: Self-pay

## 2020-03-09 ENCOUNTER — Other Ambulatory Visit: Payer: Self-pay | Admitting: Diagnostic Radiology

## 2020-03-09 DIAGNOSIS — Z1231 Encounter for screening mammogram for malignant neoplasm of breast: Secondary | ICD-10-CM

## 2020-03-09 DIAGNOSIS — Z9189 Other specified personal risk factors, not elsewhere classified: Secondary | ICD-10-CM

## 2020-04-06 ENCOUNTER — Other Ambulatory Visit: Payer: Medicare Other

## 2020-04-06 ENCOUNTER — Inpatient Hospital Stay: Payer: Medicare Other | Attending: Genetic Counselor | Admitting: Genetic Counselor

## 2020-05-01 ENCOUNTER — Telehealth: Payer: Self-pay | Admitting: Genetic Counselor

## 2020-05-01 NOTE — Telephone Encounter (Signed)
Called patient to discuss costs of upcoming appointment.  She wanted to know her exact out of pocket cost for the appointment.  I explained that when we last looked at the information, most people with Medicare, whether a straight Medicare policy, or BCBS Medicare, had a $0 out of pocket cost.  However, some were charged up to $20.  She stated that her concern was that she would come in and have a $200 OOP cost.  I explained that based on the information I had, that I would not expect that high of an OOP cost.    She was frustrated that we did not have more specific information for her and I sympathized with her.  She asked for the date/time of her appointment and I gave it to her.  She said that she will be there.

## 2020-05-04 ENCOUNTER — Inpatient Hospital Stay: Payer: Medicare Other | Attending: Genetic Counselor | Admitting: Genetic Counselor

## 2020-05-04 ENCOUNTER — Inpatient Hospital Stay: Payer: Medicare Other

## 2020-05-04 ENCOUNTER — Other Ambulatory Visit: Payer: Self-pay

## 2020-05-04 DIAGNOSIS — K862 Cyst of pancreas: Secondary | ICD-10-CM | POA: Diagnosis not present

## 2020-05-04 DIAGNOSIS — K7689 Other specified diseases of liver: Secondary | ICD-10-CM

## 2020-05-04 DIAGNOSIS — Z800 Family history of malignant neoplasm of digestive organs: Secondary | ICD-10-CM

## 2020-05-04 DIAGNOSIS — Z8041 Family history of malignant neoplasm of ovary: Secondary | ICD-10-CM

## 2020-05-04 LAB — GENETIC SCREENING ORDER

## 2020-05-05 ENCOUNTER — Encounter: Payer: Self-pay | Admitting: Genetic Counselor

## 2020-05-05 NOTE — Progress Notes (Signed)
REFERRING PROVIDER: [facility]   PRIMARY PROVIDER:  Red Christians, MD  PRIMARY REASON FOR VISIT:  1. Family history of ovarian cancer   2. Family history of pancreatic cancer     HISTORY OF PRESENT ILLNESS:   Kristin Banks, a 75 y.o. female, was seen for a [place] cancer genetics consultation due to a family history of cancer.  Kristin Banks presents to clinic today to discuss the possibility of a hereditary predisposition to cancer, to discuss genetic testing, and to further clarify her future cancer risks, as well as potential cancer risks for family members.    Kristin Banks is a 75 y.o. female with no personal history of cancer.  She reports cysts on her pancreas and liver that are being monitored.   RISK FACTORS:  Menarche was at age 32.  First live birth at age 32.  OCP use for approximately 13 years.  Ovaries intact: yes.  Hysterectomy: no.  Menopausal status: postmenopausal.  HRT use: 0 years; reports estradiol was prescribed recently but she has not been taking  Colonoscopy: yes; most recent in December 2020; reports less than 10 lifetime polyps. Mammogram within the last year: yes. Number of breast biopsies: 0. Up to date with pelvic exams: yes. Any excessive radiation exposure in the past: no  Past Medical History:  Diagnosis Date  . Allergy    rhinitis  . Cholelithiasis    simple hepatic cyst as per [place] 01/2006  . Colon polyps    PMH of X 4 since 1989,adenomatous& hyperplastic  . GERD (gastroesophageal reflux disease)    Barretts esophagus;pmh of,h pylori gastritis treated 2008  . Hyperlipidemia    LDLgoal=<90(ideally)based on NMR Lipoprofile 03/06/2008  . Thyroid disease    hypothyroidism  . Vitamin D deficiency     Past Surgical History:  Procedure Laterality Date  . arthroscopy for meniscal tear  2007  . COLONOSCOPY W/ POLYPECTOMY  2004   x4; negative 2008  . lymph node resection  1973   L neck, benign  . UPPER GI ENDOSCOPY      Dr Ladean Raya, [place] History   Socioeconomic History  . Marital status: Single    Spouse name: Not on file  . Number of children: Not on file  . Years of education: Not on file  . Highest education level: Not on file  Occupational History  . Occupation: Freight forwarder at [employer]: [employer]  Tobacco Use  . Smoking status: Never Smoker  . Smokeless tobacco: Never Used  Substance and Sexual Activity  . Alcohol use: Yes    Alcohol/week: 0.0 standard drinks    Comment: rarely  . Drug use: No  . Sexual activity: Not on file  Other Topics Concern  . Not on file  Social History Narrative   divorced   Social Determinants of Health   Financial Resource Strain: Not on file  Food Insecurity: Not on file  Transportation Needs: Not on file  Physical Activity: Not on file  Stress: Not on file  Social Connections: Not on file     FAMILY HISTORY:  We obtained a detailed, 4-generation family history.  Significant diagnoses are listed below: Family History  Problem Relation Age of Onset  . Lung cancer Mother        dx 45, 18  . Bone cancer Father        dx 69s  . Ovarian cancer Half-Sister  maternal half sister  . Cancer Maternal Grandmother 70       Ovarian, uterine, or other GYN  . Throat cancer Maternal Uncle        dx after 50  . Cancer Maternal Aunt        oral, dx after 50  . Cancer Cousin        maternal cousin; GYN cancer; dx before 74  . Pancreatic cancer Cousin        maternal cousin; dx unknown age    Kristin Banks has one daughter, age 70, without a history of cancer.  Kristin Banks has two maternal half sisters.  One of her maternal half sisters died at age 61 after being diagnosed with cancer, most likely ovarian, per Kristin Banks.  She also described possible colon cancer but was unsure if this was new primary cancer.  Kristin Banks mother died from lung cancer at age 29.  Ms. Stann's maternal uncle had a history of throat cancer and maternal aunt had a  history of mouth cancer.  Ms. Ravelo reported a GYN cancer in her maternal cousin diagnosed before the age of 24 and pancreatic cancer diagnosed in another maternal cousin.  Ms. Giusto maternal grandmother had ovarian cancer or another GYN cancer diagnosed at age 72.  No other maternal family history of cancer was reported.   Ms. Bartolotta father was diagnosed with bone cancer at age 67 and died at age 44. She had limited contact her with paternal family members but did not report other cancer.   Kristin Banks is unaware of previous family history of genetic testing for hereditary cancer risks. Patient's maternal ancestors are of African American descent, and paternal ancestors are of African American descent. There is no reported Ashkenazi Jewish ancestry. There is no known consanguinity.  GENETIC COUNSELING ASSESSMENT: Kristin Banks is a 75 y.o. female with a family history of cancer which is somewhat suggestive of a hereditary cancer syndrome and predisposition to cancer given the presence of related cancers in multiple generations and diagnoses at young ages. We, therefore, discussed and recommended the following at today's visit.   DISCUSSION: We discussed that 5 - 10% of cancer is hereditary, with most cases of hereditary ovarian cancer associated with mutations in BRCA1/2.  We also discussed hereditary cancer syndromes associated with uterine cancer at a young age, including Lynch syndrome.  There are other genes that can be associated with hereditary ovarian, uterine, and pancreatic cancer syndromes.  Type of cancer risk and level of risk are gene-specific.  We discussed that testing is beneficial for several reasons, including knowing about other cancer risks, identifying potential screening and risk-reduction options that may be appropriate, and to understanding if other family members could be at risk for cancer and allowing them to undergo genetic testing.  We reviewed the characteristics, features  and inheritance patterns of hereditary cancer syndromes. We also discussed genetic testing, including the appropriate family members to test, the process of testing, insurance coverage and turn-around-time for results. We discussed the implications of a negative, positive, carrier and/or variant of uncertain significant result. We discussed that negative results would be uninformative given that Kristin Banks does not have a personal history of cancer. We recommended Kristin Banks pursue genetic testing for a panel that contains genes associated with gynecologic and pancreatic cancers.  Kristin Banks was offered a common hereditary cancer panel (47 genes) and an expanded pan-cancer panel (8 genes). Kristin Banks was informed of the benefits and limitations of each  panel, including that expanded pan-cancer panels contain several genes that do not have clear management guidelines at this point in time.  We also discussed that as the number of genes included on a panel increases, the chances of variants of uncertain significance increases.  After considering the benefits and limitations of each gene panel, Kristin Banks elected to have an expanded Radio broadcast assistant through [employer].  The Multi-Cancer + RNA Panel offered by [facility] includes sequencing and/or deletion/duplication analysis of the following 84 genes:  AIP*, ALK, APC*, ATM*, AXIN2*, BAP1*, BARD1*, BLM*, BMPR1A*, BRCA1*, BRCA2*, BRIP1*, CASR, CDC73*, CDH1*, CDK4, CDKN1B*, CDKN1C*, CDKN2A, CEBPA, CHEK2*, CTNNA1*, DICER1*, DIS3L2*, EGFR, EPCAM, FH*, FLCN*, GATA2*, GPC3, GREM1, HOXB13, HRAS, KIT, MAX*, MEN1*, MET, MITF, MLH1*, MSH2*, MSH3*, MSH6*, MUTYH*, NBN*, NF1*, NF2*, NTHL1*, PALB2*, PDGFRA, PHOX2B, PMS2*, POLD1*, POLE*, POT1*, PRKAR1A*, PTCH1*, PTEN*, RAD50*, RAD51C*, RAD51D*, RB1*, RECQL4, RET, RUNX1*, SDHA*, SDHAF2*, SDHB*, SDHC*, SDHD*, SMAD4*, SMARCA4*, SMARCB1*, SMARCE1*, STK11*, SUFU*, TERC, TERT, TMEM127*, Tp53*, TSC1*, TSC2*, VHL*, WRN*, and WT1.  RNA analysis  is performed for * genes.   Based on Kristin Banks's family history of cancer, she meets medical criteria for genetic testing. Despite that she meets criteria, she may still have an out of pocket cost.  We discussed that some people do not want to undergo genetic testing due to fear of genetic discrimination.  A federal law called the Genetic Information Non-Discrimination Act (GINA) of 2008 helps protect individuals against genetic discrimination based on their genetic test results.  It impacts both health insurance and employment.  With health insurance, it protects against increased premiums, being kicked off insurance or being forced to take a test in order to be insured.  For employment it protects against hiring, firing and promoting decisions based on genetic test results.  GINA does not apply to those in the [employer], those who work for companies with less than 15 employees, and new life insurance or long-term disability insurance policies.  Health status due to a cancer diagnosis is not protected under GINA.  PLAN: After considering the risks, benefits, and limitations, Kristin Banks provided informed consent to pursue genetic testing and the blood sample was sent to [facility] for analysis of the Multi-Cancer +RNA Panel. Results should be available within approximately 2-3 weeks' time, at which point they will be disclosed by telephone to Kristin Banks, as will any additional recommendations warranted by these results. Kristin Banks will receive a summary of her genetic counseling visit and a copy of her results once available. This information will also be available in Epic.   Lastly, we encouraged Kristin Banks to remain in contact with cancer genetics annually so that we can continuously update the family history and inform her of any changes in cancer genetics and testing that may be of benefit for this family.   Ms. Kiester questions were answered to her satisfaction today. Our contact  information was provided should additional questions or concerns arise. Thank you for the referral and allowing [place] to share in the care of your patient.   Anwyn Kriegel M. Joette Catching, [place], [facility] Genetic Counselor [email or web address] (P) [phone]   The patient was seen for a total of 35 minutes in face-to-face genetic counseling.  Drs. Magrinat, Lindi Adie and/or Burr Medico were available to discuss this case as needed.  _______________________________________________________________________ For Office Staff:  Number of people involved in session: 1 Was an Intern/ student involved with case: no

## 2020-05-27 ENCOUNTER — Encounter: Payer: Self-pay | Admitting: Genetic Counselor

## 2020-05-27 ENCOUNTER — Telehealth: Payer: Self-pay | Admitting: Genetic Counselor

## 2020-05-27 DIAGNOSIS — Z1379 Encounter for other screening for genetic and chromosomal anomalies: Secondary | ICD-10-CM | POA: Insufficient documentation

## 2020-05-27 NOTE — Telephone Encounter (Signed)
Contacted patient in attempt to disclose results of genetic testing.  LVM with contact information requesting a call back.  

## 2020-06-01 NOTE — Telephone Encounter (Signed)
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  Discussed breast, ovarian, and pancreatic cancer risks, management strategies, and implications for family members.  Interested in referral to high risk breast clinic and pancreatic cancer screening given FH of pancreatic cancer and mutation.

## 2020-06-05 ENCOUNTER — Encounter: Payer: Self-pay | Admitting: Genetic Counselor

## 2020-06-05 ENCOUNTER — Ambulatory Visit: Payer: Self-pay | Admitting: Genetic Counselor

## 2020-06-05 DIAGNOSIS — Z800 Family history of malignant neoplasm of digestive organs: Secondary | ICD-10-CM

## 2020-06-05 DIAGNOSIS — Z1379 Encounter for other screening for genetic and chromosomal anomalies: Secondary | ICD-10-CM

## 2020-06-05 DIAGNOSIS — Z1589 Genetic susceptibility to other disease: Secondary | ICD-10-CM

## 2020-06-05 DIAGNOSIS — Z8041 Family history of malignant neoplasm of ovary: Secondary | ICD-10-CM

## 2020-06-05 DIAGNOSIS — Z1509 Genetic susceptibility to other malignant neoplasm: Secondary | ICD-10-CM

## 2020-06-05 NOTE — Progress Notes (Addendum)
GENETIC TEST RESULTS  Patient Name: Kristin Banks Patient Age: 75 y.o. Encounter Date: 06/05/2020  Referring Provider: [facility]    Ms. Lutter was seen in the [place] clinic on May 04, 2020 due to a family history of cancer and concern regarding a hereditary predisposition to cancer in the family. Please refer to the prior Genetics clinic note for more information regarding Ms. Brayfield's medical and family histories and our assessment at the time.   FAMILY HISTORY:  We obtained a detailed, 4-generation family history.  Significant diagnoses are listed below: Family History  Problem Relation Age of Onset  . Lung cancer Mother        dx 45, 19  . Bone cancer Father        dx 32s  . Ovarian cancer Half-Sister        maternal half sister  . Cancer Maternal Grandmother 70       Ovarian, uterine, or other GYN  . Throat cancer Maternal Uncle        dx after 50  . Cancer Maternal Aunt        oral, dx after 50  . Cancer Cousin        maternal cousin; GYN cancer; dx before 52  . Pancreatic cancer Cousin        maternal cousin; dx unknown age    Ms. Sangiovanni has one daughter, age 74, without a history of cancer.  Ms. Gritz has two maternal half sisters.  One of her maternal half sisters died at age 24 after being diagnosed with cancer, most likely ovarian, per Ms. Sendejo.  She also described possible colon cancer but was unsure if this was new primary cancer.  Ms. Kevorkian mother died from lung cancer at age 77.  Ms. Renaldo's maternal uncle had a history of throat cancer and maternal aunt had a history of mouth cancer.  Ms. Yanko reported a GYN cancer in her maternal cousin diagnosed before the age of 18 and pancreatic cancer diagnosed in another maternal cousin.  Ms. Godby maternal grandmother had ovarian cancer or another GYN cancer diagnosed at age 13.  No other maternal family history of cancer was reported.   Ms. Drewry father was diagnosed  with bone cancer at age 46 and died at age 25. She had limited contact her with paternal family members but did not report other cancer.   Ms. Cohick is unaware of previous family history of genetic testing for hereditary cancer risks. Patient's maternal ancestors are of African American descent, and paternal ancestors are of African American descent. There is no reported Ashkenazi Jewish ancestry. There is no known consanguinity.  Update: Per call from Ms. Halle on 06/10/2020, her maternal cousin had lymphoma, not pancreatic cancer.   GENETIC TESTING:  At the time of Ms. Puertas's visit, we recommended she pursue genetic testing. 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 There were no deleterious detected in other genes.  The Multi-Cancer + RNA Panel offered by [facility] includes sequencing and/or deletion/duplication analysis of the following 84 genes:  AIP*, ALK, APC*, ATM*, AXIN2*, BAP1*, BARD1*, BLM*, BMPR1A*, BRCA1*, BRCA2*, BRIP1*, CASR, CDC73*, CDH1*, CDK4, CDKN1B*, CDKN1C*, CDKN2A, CEBPA, CHEK2*, CTNNA1*, DICER1*, DIS3L2*, EGFR, EPCAM, FH*, FLCN*, GATA2*, GPC3, GREM1, HOXB13, HRAS, KIT, MAX*, MEN1*, MET, MITF, MLH1*, MSH2*, MSH3*, MSH6*, MUTYH*, NBN*, NF1*, NF2*, NTHL1*, PALB2*, PDGFRA, PHOX2B, PMS2*, POLD1*, POLE*, POT1*, PRKAR1A*, PTCH1*, PTEN*,  RAD50*, RAD51C*, RAD51D*, RB1*, RECQL4, RET, RUNX1*, SDHA*, SDHAF2*, SDHB*, SDHC*, SDHD*, SMAD4*, SMARCA4*, SMARCB1*, SMARCE1*, STK11*, SUFU*, TERC, TERT, TMEM127*, Tp53*, TSC1*, TSC2*, VHL*, WRN*, and WT1.  RNA analysis is performed for * genes.  The test report has been scanned into EPIC and is located under the Molecular Pathology section of the Results Review tab.  A portion of the result report is included below for reference.     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  At this time, it is unknown if this variant is associated with increased cancer risk or if this is a normal finding, but most variants such as this get reclassified to being inconsequential. It should not be used to make medical management decisions. With time, we suspect the lab will determine the significance of this variant, if any. If we do learn more about it, we will try to contact Ms. Monforte to discuss it further. However, it is important to stay in touch with [place] periodically and keep the address and phone number up to date.    DISCUSSION: The ATM gene is involved in the detection and surveillance of DNA damage.  ATM phosphorylation of BRCA1 is critical for proper response to DNA double-strand breaks.  This is believed to be the reason for the role ATM has in breast cancer risk. Women who are heterozygous ATM carriers have an increase breast cancer risk.  They have 5-fold increased risk of breast cancer <50 years, and 2-3 fold increased risk for breast cancer overall.   Management for individuals with ATM mutations can be found in the NCCN guidelines (v.1.2022).  These guidelines recommend the following:  Breast Cancer  Absolute risk: 15%-40%, compared to general population risk of approximately 12.8%  Screening: Annual mammogram with consideration of tomosynthesis and consider breast MRI with contrast starting at age 31 years.  Risk Reducing Mastectomy: Evidence insufficient, consider based on family history  Ovarian Cancer  Absolute risk: <3%, compared to general population risk of approximately 1.3%  Risk Reducing Salpingo-oophorectomy: Evidence insufficient, manage based on family history   Pancreatic Cancer  Absolute risk: ~5-10%, compared to general population risk of approximately 1.6%  Pancreatic screening for mutation carriers with a family history of pancreatic cancer  Other Cancer Risks  Unknown or insufficient  evidence for prostate cancer in males  MANAGEMENT:  Ms. Huot was informed of her increased risk for breast cancer based on the detected of an ATM mutation.  She agreed to follow up with the high risk breast clinic at the [facility] to discuss breast cancer screening. A referral will be placed.   Ms. Losh has an increased chance of pancreatic cancer given the ATM mutation as well as a family history of pancreatic cancer.  She is interested in pancreatic cancer screening.  She knows to follow up with her gastroenterologist, Dr. Michail Sermon, to discuss this recommendation. Update: Per telephone call with Ms. Coble on 06/10/2020, her maternal cousin had lymphoma, not pancreatic cancer.  Thus, in the absence of a family history of pancreatic cancer, Ms. Marando does not meet criteria for pancreatic cancer screening at this time.  FAMILY MEMBERS: It is important that all of Ms. Ebanks's relatives (both men and women) know of the presence of this gene mutation. Site-specific genetic testing can sort out who in the family is at risk and who is not.   Ms. Pearman children and siblings have a 50% chance to have  inherited this mutation. We recommend they have genetic testing for this same mutation, as identifying the presence of this mutation would allow them to also take advantage of risk-reducing measures.   Individuals with a ATM mutation are at a greater risk for having children with Ataxia-telangiectasia (A-T).  AT is characterized by progressive cerebellar degeneration (ataxia), dilated blood vessels in the eyes and skin (telangiectasia), immunodeficiency, chromosomal instability, increased sensitivity to ionizing radiation and a predisposition to lymphoma and leukemia.  Therefore, individuals of childbearing age who have a known ATM mutation may want to consider having their reproductive partner tested to determine their risk for having a child with A-T.  SUPPORT AND RESOURCES: If Ms. Swaby is  interested in ATM-specific information and support, there are two groups, Facing Our Risk (www.facingourrisk.com) and Bright Pink (www.brightpink.org) which some people have found useful. They provide opportunities to speak with other individuals from high-risk families. To locate genetic counselors in other cities, visit the website of the [employer] (ArtistMovie.se) and Secretary/administrator for a Social worker by zip code.  We encouraged Ms. Enzor to remain in contact with [place] on an annual basis so we can update her personal and family histories, and let her know of advances in cancer genetics that may benefit the family. Our contact number was provided. Ms. Harwick questions were answered to her satisfaction today, and she knows she is welcome to call anytime with additional questions.   Yohanna Tow M. Joette Catching, [place], [facility] Genetic Counselor [email or web address] (P) [phone]

## 2020-06-10 ENCOUNTER — Telehealth: Payer: Self-pay | Admitting: Genetic Counselor

## 2020-06-10 ENCOUNTER — Encounter: Payer: Self-pay | Admitting: Genetic Counselor

## 2020-06-10 NOTE — Telephone Encounter (Signed)
Kristin Banks mentioned that her maternal cousin had lymphoma rather than pancreatic cancer.  Discussed that those with ATM mutation without a family history of pancreatic cancer do not meet criteria for pancreatic cancer screening at this time.  Discussed reason for referral to high risk breast clinic given ATM mutation.  Kristin Banks said she will give scheduler who reached out to her about scheduling for [facility] breast clinic a call back.

## 2020-06-12 ENCOUNTER — Telehealth: Payer: Self-pay | Admitting: Genetic Counselor

## 2020-06-12 NOTE — Telephone Encounter (Signed)
Called Ms. Mannor to answer questions about HR clinic.  Discussed reason why breast MRIs are considered for patients with ATM mutations and discussed that provider at high risk clinic would be able to discuss this consideration further.  

## 2020-06-23 ENCOUNTER — Ambulatory Visit (INDEPENDENT_AMBULATORY_CARE_PROVIDER_SITE_OTHER): Payer: Medicare Other | Admitting: Otolaryngology

## 2020-07-15 ENCOUNTER — Other Ambulatory Visit: Payer: Self-pay | Admitting: Gastroenterology

## 2020-07-15 DIAGNOSIS — K7689 Other specified diseases of liver: Secondary | ICD-10-CM

## 2020-07-15 DIAGNOSIS — K862 Cyst of pancreas: Secondary | ICD-10-CM

## 2020-07-18 ENCOUNTER — Ambulatory Visit
Admission: RE | Admit: 2020-07-18 | Discharge: 2020-07-18 | Disposition: A | Discharge status: Home / Self Care | Payer: Medicare Other | Source: Ambulatory Visit | Attending: Gastroenterology | Admitting: Gastroenterology

## 2020-07-18 DIAGNOSIS — K7689 Other specified diseases of liver: Secondary | ICD-10-CM

## 2020-07-18 DIAGNOSIS — K862 Cyst of pancreas: Secondary | ICD-10-CM

## 2020-07-18 MED ORDER — GADOBENATE DIMEGLUMINE 529 MG/ML IV SOLN
20.0000 mL | Freq: Once | INTRAVENOUS | Status: AC | PRN
  Administered 2020-07-18: 20 mL via INTRAVENOUS

## 2020-07-20 ENCOUNTER — Telehealth: Payer: Self-pay | Admitting: Genetic Counselor

## 2020-07-20 NOTE — Telephone Encounter (Signed)
Received forwarded message that Kristin Banks received a bill and was not familiar with name of provider.  Returned call and LVM with contact information if billing-related question is related to genetics.

## 2020-07-28 ENCOUNTER — Other Ambulatory Visit: Payer: Self-pay

## 2020-07-28 ENCOUNTER — Ambulatory Visit
Admission: RE | Admit: 2020-07-28 | Discharge: 2020-07-28 | Disposition: A | Discharge status: Home / Self Care | Payer: Medicare Other | Source: Ambulatory Visit | Attending: Specialist | Admitting: Specialist

## 2020-07-28 ENCOUNTER — Other Ambulatory Visit: Payer: Self-pay | Admitting: Specialist

## 2020-07-28 DIAGNOSIS — D892 Hypergammaglobulinemia, unspecified: Secondary | ICD-10-CM

## 2020-07-29 ENCOUNTER — Other Ambulatory Visit: Payer: Medicare Other

## 2020-11-17 ENCOUNTER — Other Ambulatory Visit: Payer: Self-pay | Admitting: Internal Medicine

## 2020-11-17 DIAGNOSIS — Z1231 Encounter for screening mammogram for malignant neoplasm of breast: Secondary | ICD-10-CM

## 2021-02-24 ENCOUNTER — Ambulatory Visit: Payer: Medicare Other | Admitting: Internal Medicine

## 2021-03-15 ENCOUNTER — Other Ambulatory Visit: Payer: Self-pay

## 2021-03-15 ENCOUNTER — Ambulatory Visit
Admission: RE | Admit: 2021-03-15 | Discharge: 2021-03-15 | Disposition: A | Discharge status: Home / Self Care | Payer: Medicare Other | Source: Ambulatory Visit | Attending: Internal Medicine | Admitting: Internal Medicine

## 2021-03-15 DIAGNOSIS — Z1231 Encounter for screening mammogram for malignant neoplasm of breast: Secondary | ICD-10-CM

## 2021-05-26 ENCOUNTER — Other Ambulatory Visit: Payer: Self-pay | Admitting: Gastroenterology

## 2021-05-26 DIAGNOSIS — K869 Disease of pancreas, unspecified: Secondary | ICD-10-CM

## 2021-05-26 DIAGNOSIS — K7689 Other specified diseases of liver: Secondary | ICD-10-CM

## 2021-11-09 ENCOUNTER — Other Ambulatory Visit: Payer: Medicare Other

## 2021-11-15 ENCOUNTER — Other Ambulatory Visit: Payer: Medicare Other

## 2021-11-30 ENCOUNTER — Ambulatory Visit
Admission: RE | Admit: 2021-11-30 | Discharge: 2021-11-30 | Disposition: A | Discharge status: Home / Self Care | Payer: Medicare Other | Source: Ambulatory Visit | Attending: Gastroenterology | Admitting: Gastroenterology

## 2021-11-30 DIAGNOSIS — K869 Disease of pancreas, unspecified: Secondary | ICD-10-CM

## 2021-11-30 DIAGNOSIS — K7689 Other specified diseases of liver: Secondary | ICD-10-CM

## 2021-11-30 MED ORDER — GADOPICLENOL 0.5 MMOL/ML IV SOLN
9.0000 mL | Freq: Once | INTRAVENOUS | Status: AC | PRN
  Administered 2021-11-30: 9 mL via INTRAVENOUS

## 2021-12-07 ENCOUNTER — Other Ambulatory Visit: Payer: Medicare Other

## 2021-12-16 ENCOUNTER — Telehealth: Payer: Self-pay | Admitting: Genetic Counselor

## 2021-12-16 DIAGNOSIS — Z1379 Encounter for other screening for genetic and chromosomal anomalies: Secondary | ICD-10-CM

## 2021-12-16 NOTE — Telephone Encounter (Addendum)
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  Discussed previous ATM result.  Scheduled appt on 9/25 at 2pm to discuss further.

## 2021-12-16 NOTE — Telephone Encounter (Signed)
LVM requesting call back to discuss genetic testing updates and HOXB13 reclassification.

## 2021-12-20 ENCOUNTER — Other Ambulatory Visit: Payer: Self-pay

## 2021-12-20 ENCOUNTER — Encounter: Payer: Self-pay | Admitting: Genetic Counselor

## 2021-12-20 ENCOUNTER — Inpatient Hospital Stay: Payer: Medicare Other | Attending: Genetic Counselor | Admitting: Genetic Counselor

## 2021-12-20 DIAGNOSIS — Z800 Family history of malignant neoplasm of digestive organs: Secondary | ICD-10-CM | POA: Diagnosis not present

## 2021-12-20 DIAGNOSIS — Z8041 Family history of malignant neoplasm of ovary: Secondary | ICD-10-CM | POA: Diagnosis not present

## 2021-12-20 DIAGNOSIS — Z1379 Encounter for other screening for genetic and chromosomal anomalies: Secondary | ICD-10-CM | POA: Diagnosis not present

## 2021-12-20 DIAGNOSIS — Z1589 Genetic susceptibility to other disease: Secondary | ICD-10-CM

## 2021-12-20 DIAGNOSIS — Z803 Family history of malignant neoplasm of breast: Secondary | ICD-10-CM

## 2021-12-20 DIAGNOSIS — Z1509 Genetic susceptibility to other malignant neoplasm: Secondary | ICD-10-CM

## 2021-12-20 DIAGNOSIS — Z1501 Genetic susceptibility to malignant neoplasm of breast: Secondary | ICD-10-CM

## 2021-12-21 ENCOUNTER — Encounter: Payer: Self-pay | Admitting: Genetic Counselor

## 2021-12-21 DIAGNOSIS — Z803 Family history of malignant neoplasm of breast: Secondary | ICD-10-CM

## 2021-12-21 DIAGNOSIS — Z1501 Genetic susceptibility to malignant neoplasm of breast: Secondary | ICD-10-CM | POA: Insufficient documentation

## 2021-12-21 DIAGNOSIS — Z8041 Family history of malignant neoplasm of ovary: Secondary | ICD-10-CM

## 2021-12-21 DIAGNOSIS — Z1509 Genetic susceptibility to other malignant neoplasm: Secondary | ICD-10-CM

## 2021-12-21 DIAGNOSIS — Z800 Family history of malignant neoplasm of digestive organs: Secondary | ICD-10-CM

## 2021-12-21 HISTORY — DX: Genetic susceptibility to malignant neoplasm of breast: Z15.01

## 2021-12-21 HISTORY — DX: Family history of malignant neoplasm of ovary: Z80.41

## 2021-12-21 HISTORY — DX: Family history of malignant neoplasm of breast: Z80.3

## 2021-12-21 HISTORY — DX: Family history of malignant neoplasm of digestive organs: Z80.0

## 2021-12-21 HISTORY — DX: Genetic susceptibility to other malignant neoplasm: Z15.09

## 2021-12-21 NOTE — Progress Notes (Signed)
GENETIC TEST RESULTS   Patient Name: Kristin Banks Patient Age: 76 y.o. Encounter Date: 12/20/2021  Kristin Banks was first seen in the [place] clinic on May 04, 2020 due to a family history of ovarian cancer and concern regarding a hereditary predisposition to cancer in the family. At that time, she was found at have a mutation in the ATM mutation.  She returned to clinic today to discuss her amended genetic testing report with upgraded HOXB13 classification as well as review previously detected ATM mutation.   FAMILY HISTORY:  We obtained a detailed, 4-generation family history.  Significant diagnoses are listed below: Family History  Problem Relation Age of Onset   Lung cancer Mother        dx 42, 42   Bone cancer Father        dx 42s   Cancer Maternal Aunt        oral, dx after 42   Throat cancer Maternal Uncle        dx after 76   Cancer Maternal Grandmother 70       Ovarian, uterine, or other GYN   Cancer Cousin        maternal cousin; GYN cancer; dx before 34   Lymphoma Cousin        maternal cousin; dx unknown age   Ovarian cancer Half-Sister        maternal half sister   Pancreatic cancer Half-Sister        paternal half sister; d. 77   Breast cancer Half-Sister        paternal half sister; dx before 60    Kristin Banks is unaware of family history of genetic testing.  There is no known Ashkenazi Jewish ancestry and no reported consanguinity.   GENETIC TESTING:   Kristin Banks tested positive for a single pathogenic variant in the ATM gene. Specifically, this variant is c.3627del (p.Phe1209Leufs*6).  This variant was first reported on her testing in May 25, 2020.   On November 18, 2021, an amended report was issued.  The likely benign variant in HOXB13 at  c.853del (p.*[id number]*96) has been reclassified to an increased risk allele.  The change in variant classification was made as a result of re-review of evidence in light of new variant interpretation  guidelines and/or new information.   No other deleterious variants were detected in the [facility] Multi-Cancer +RNA Panel.  The Multi-Cancer + RNA Panel offered by [facility] includes sequencing and/or deletion/duplication analysis of the following 84 genes:  AIP*, ALK, APC*, ATM*, AXIN2*, BAP1*, BARD1*, BLM*, BMPR1A*, BRCA1*, BRCA2*, BRIP1*, CASR, CDC73*, CDH1*, CDK4, CDKN1B*, CDKN1C*, CDKN2A, CEBPA, CHEK2*, CTNNA1*, DICER1*, DIS3L2*, EGFR, EPCAM, FH*, FLCN*, GATA2*, GPC3, GREM1, HOXB13, HRAS, KIT, MAX*, MEN1*, MET, MITF, MLH1*, MSH2*, MSH3*, MSH6*, MUTYH*, NBN*, NF1*, NF2*, NTHL1*, PALB2*, PDGFRA, PHOX2B, PMS2*, POLD1*, POLE*, POT1*, PRKAR1A*, PTCH1*, PTEN*, RAD50*, RAD51C*, RAD51D*, RB1*, RECQL4, RET, RUNX1*, SDHA*, SDHAF2*, SDHB*, SDHC*, SDHD*, SMAD4*, SMARCA4*, SMARCB1*, SMARCE1*, STK11*, SUFU*, TERC, TERT, TMEM127*, Tp53*, TSC1*, TSC2*, VHL*, WRN*, and WT1.  RNA analysis is performed for * genes.   The test report has been scanned into EPIC and is located under the Molecular Pathology section of the Results Review tab.  A portion of the result report is included below for reference. The amended genetic testing reported out on November 18, 2021.    Genetic testing did identify a variant of uncertain significance (VUS) in the PDGFRA gene called c.2965A>G (p.Ile989Val).  At this time, it is unknown if this variant is  associated with increased cancer risk or if this is a normal finding, but most variants such as this get reclassified to being inconsequential. It should not be used to make medical management decisions. With time, we suspect the lab will determine the significance of this variant, if any. If we do learn more about it, we will try to contact Kristin Banks to discuss it further. However, it is important to stay in touch with [place] periodically and keep the address and phone number up to date.  Cancer Risks for ATM: Women have a 20-30% lifetime risk of breast cancer. 2-3% risk for epithelial ovarian  cancer 5-10% risk for pancreatic cancer  There is emerging evidence suggesting an increased risk for prostate cancer.  Research is continuing to help learn more about the cancers associated with ATM pathogenic variants and what the exact risks are to develop these cancers.  ATM Management Recommendations:  Breast Screening/Risk Reduction: Breast cancer screening includes: Breast awareness beginning at age 65 Monthly self-breast examination beginning at age 32 Clinical breast examination every 6-12 months beginning at age 27 or at the age of the earliest diagnosed breast cancer in the family, if onset was before age 64 Annual mammogram with consideration of tomosynthesis starting at age 73 or 50 years prior to the youngest age of diagnosis, whichever comes first Consider breast MRI with and without contrast starting at age 71-35 Evidence is insufficient for a prophylactic risk-reducing mastectomy, manage based on family history  For patients who are treated for breast cancer and have not had bilateral mastectomy, screening should continue as described  Ovarian Cancer Screening/Risk Reduction: Evidence insufficient for risk-reducing salpingo oophorectomy; manage based on family history Seek prompt evaluation with onset of signs/symptoms related to ovarian cancer If there is a family history of ovarian cancer, have a discussion with your physician about the benefits and limitations of screening and risk reducing strategies  Pancreatic Cancer Screening/Risk Reduction: Avoid smoking, heavy alcohol use, and obesity. Pancreatic cancer screening may be considered in those with a family history of pancreatic cancer (first- or second-degree relative). Screening includes annual endoscopic ultrasound (preferred) and/or MRI of the pancreas starting at age 66 or 12 years younger than the earliest age diagnosis in the family.   Prostate Cancer Screening: Consider beginning annual PSA blood test and  digital rectal exams at age 3.  Additional considerations: There is insufficient evidence to recommend against radiation therapy.  Individuals with a single pathogenic ATM variant are also carriers of ataxia telangiectasia. Ataxia telangiectasia is associated with childhood cancer risks as well as other medical problems (such as difficulty with movement, balance and coordination problems, neuropathy, and weakened immunity). For there to be a risk of ataxia telangiectasia in offspring, both the patient and their partner would each have to carry a pathogenic variant in ATM; in this case, the risk to have an affected child is 25%.  Cancer risks for HOXB13 and management recommendations:   Prostate cancer for males: ~33% lifetime risk  Consider prostate-specific antigen screening every year starting at 27.  Consider digital rectal exam when PSA testing is done.   This information is based on current understanding of the ATM and HOXB13 and may change in the future.  Implications for Family Members: Hereditary predisposition to cancer due to pathogenic variants in the ATM gene and increase risk alleles in the HOXB13 both have autosomal dominant inheritance. This means that an individual with a pathogenic variant/increased risk allele has a 50% chance of passing the condition on  to his/her offspring. Identification of a pathogenic variant/increased risk allele allows for the recognition of at-risk relatives who can pursue testing for the familial variant.   Family members are encouraged to consider genetic testing for this familial pathogenic variant. As there are generally no childhood cancer risks associated with single pathogenic/increased risk variants in the ATM or HOXB13 genes, individuals in the family are not recommended to have testing until they reach at least 76 years of age. They may contact our office at [phone] for more information or to schedule an appointment. Complimentary testing for  the familial variant is available for 150 days from Kristin Banks amended report date (November 18, 2021). Family members who live outside of the area are encouraged to find a genetic counselor in their area by visiting: PanelJobs.es.  Resources: FORCE (Facing Our Risk of Cancer Empowered) is a resource for those with a hereditary predisposition to develop cancer.  FORCE provides information about risk reduction, advocacy, legislation, and clinical trials.  Additionally, FORCE provides a platform for collaboration and support; which includes: peer navigation, message boards, local support groups, a toll-free helpline, research registry and recruitment, advocate training, published medical research, webinars, brochures, mastectomy photos, and more.  For more information, visit www.facingourrisk.org  Plan:  GI: Given her ATM mutation and her family history of pancreatic cancer in her paternal half sister, Kristin Banks and her providers should consider pancreatic cancer screening. 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  Her most recent abdominal MRI was 11/2021.  She knows that she should continue to follow the recommendations of her GI provider. She stated that she is considering being followed by [facility].  She knows to reach out to Dr. Kathline Magic office if she interested in a referral to that clinic.  Information communicated to [facility] at Dr. Kathline Magic office.  Breast: Kristin Banks was previously referred to the high risk breast clinic at [facility] to discuss appropriate breast screening given her ATM mutation.  She declined this referral.  Her PCP was notified, and she knows to have a conversation about appropriate breast screening that takes into account her mutation status, her family history, her age/health status, and her breast density.  GYN: Kristin Banks ovaries are intact.  There is insufficient evidence to  recommend BSO based on the ATM result alone. We discussed the importance of having regular physical exams, discussing risk/management with her GYN provider, and reporting of any signs or symptoms related to ovarian cancer.  Given no known cancer risks for females with HOXB13 increased risks alleles, no personal management changes are needed based on this finding.  Family: She was provided with a family letter to encourage family members to be tested for both HOXB13 and ATM mutations. She knows that her daughter, both paternal and maternal half siblings, and more distant relatives should seek counseling/testing.   We encouraged Kristin Banks to remain in contact with [place] on an annual basis so we can update her personal and family histories, and let her know of advances in cancer genetics that may benefit the family. Our contact number was provided. Kristin Banks questions were answered to her satisfaction today, and she knows she is welcome to call anytime with additional questions.   Kinta Martis M. Joette Catching, [place], [facility] Genetic Counselor [email or web address] (P) [phone]  The patient was seen for a total of 30 minutes in face-to-face genetic counseling.

## 2022-01-10 ENCOUNTER — Other Ambulatory Visit: Payer: Self-pay | Admitting: Internal Medicine

## 2022-01-10 DIAGNOSIS — Z1231 Encounter for screening mammogram for malignant neoplasm of breast: Secondary | ICD-10-CM

## 2022-02-09 ENCOUNTER — Other Ambulatory Visit (HOSPITAL_COMMUNITY): Payer: Self-pay | Admitting: Obstetrics & Gynecology

## 2022-02-09 DIAGNOSIS — R102 Pelvic and perineal pain: Secondary | ICD-10-CM

## 2022-02-09 DIAGNOSIS — N83209 Unspecified ovarian cyst, unspecified side: Secondary | ICD-10-CM

## 2022-02-11 ENCOUNTER — Ambulatory Visit (HOSPITAL_COMMUNITY)
Admission: RE | Admit: 2022-02-11 | Discharge: 2022-02-11 | Disposition: A | Discharge status: Home / Self Care | Payer: Medicare Other | Source: Ambulatory Visit | Attending: Obstetrics & Gynecology | Admitting: Obstetrics & Gynecology

## 2022-02-11 DIAGNOSIS — R102 Pelvic and perineal pain: Secondary | ICD-10-CM | POA: Insufficient documentation

## 2022-02-11 DIAGNOSIS — N83209 Unspecified ovarian cyst, unspecified side: Secondary | ICD-10-CM | POA: Insufficient documentation

## 2022-03-03 ENCOUNTER — Ambulatory Visit: Payer: Medicare Other | Admitting: Rehabilitative and Restorative Service Providers"

## 2022-03-04 NOTE — Therapy (Signed)
OUTPATIENT PHYSICAL THERAPY THORACOLUMBAR EVALUATION   Patient Name: Kristin Banks MRN: [id number] DOB:1945-10-15, 76 y.o., female Today's Date: 03/04/2022  END OF SESSION:   Past Medical History:  Diagnosis Date   Allergy    rhinitis   Cholelithiasis    simple hepatic cyst as per [place] 01/2006   Colon polyps    PMH of X 4 since 1989,adenomatous& hyperplastic   Family history of breast cancer 12/21/2021   Family history of ovarian cancer 12/21/2021   Family history of pancreatic cancer 12/21/2021   GERD (gastroesophageal reflux disease)    Barretts esophagus;pmh of,h pylori gastritis treated 2008   Hyperlipidemia    LDLgoal=<90(ideally)based on NMR Lipoprofile 20/91/9802   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   Thyroid disease    hypothyroidism   Vitamin D deficiency    Past Surgical History:  Procedure Laterality Date   arthroscopy for meniscal tear  2007   COLONOSCOPY W/ POLYPECTOMY  2004   x4; negative 2008   lymph node resection  1973   L neck, benign   UPPER GI ENDOSCOPY     Dr Ladean Raya, [facility]   Patient Active Problem List   Diagnosis Date Noted   Monoallelic mutation of ATM gene, monoallelic mutation of CHTV81 gene 12/21/2021   Family history of pancreatic cancer 12/21/2021   Family history of ovarian cancer 12/21/2021   Family history of breast cancer 12/21/2021   Genetic testing 05/27/2020   Reduced libido 03/13/2018   Mouth pain 03/24/2017   Ganglion cyst of wrist, left 11/25/2016   Precordial pain 02/54/8628   Systolic hypertension 24/17/5301   Crushing injury of right thumb 11/20/2015   Sleep apnea 03/06/2012   PTOSIS 05/20/2010   FACIAL PARESTHESIA, LEFT 05/20/2010   MICROSCOPIC HEMATURIA 03/19/2010   VITAMIN D DEFICIENCY 10/21/2009   ANEMIA 10/21/2009   ARTHRALGIA 06/30/5911   METABOLIC SYNDROME X 68/59/9234   POSTMENOPAUSAL SYNDROME 07/08/2009   HYPOTHYROIDISM 12/12/2008   HYPERLIPIDEMIA 12/12/2008   ALLERGIC RHINITIS 12/12/2008   GERD  12/12/2008   BARRETTS ESOPHAGUS 12/12/2008   CHOLELITHIASIS 12/12/2008   COLONIC POLYPS, HX OF 12/12/2008    PCP: Red Christians, MD   REFERRING PROVIDER: Laroy Apple, MD   REFERRING DIAG:  M43.06 (ICD-10-CM) - 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  ONSET DATE: ***  SUBJECTIVE:  SUBJECTIVE STATEMENT: ***  PERTINENT HISTORY:  L neck lymph node resection, GERD, L facial paresthesia, metabolic syndrome  PAIN:  Are you having pain? {OPRCPAIN:27236}  PRECAUTIONS: {Therapy precautions:24002}  WEIGHT BEARING RESTRICTIONS: {Yes ***/No:24003}  FALLS:  Has patient fallen in last 6 months? {fallsyesno:27318}  LIVING ENVIRONMENT: Lives with: {OPRC lives with:25569::"lives with their family"} Lives in: {Lives in:25570} Stairs: {opstairs:27293} Has following equipment at home: {Assistive devices:23999}  OCCUPATION: ***  PLOF: {PLOF:24004}  PATIENT GOALS: ***  NEXT MD VISIT:   OBJECTIVE:   DIAGNOSTIC FINDINGS:  ***  PATIENT SURVEYS:  {rehab surveys:24030}  SCREENING FOR RED FLAGS: Bowel or bladder incontinence: {Yes/No:304960894} Spinal tumors: {Yes/No:304960894} Cauda equina syndrome: {Yes/No:304960894} Compression fracture: {Yes/No:304960894} Abdominal aneurysm: {Yes/No:304960894}  COGNITION: Overall cognitive status: {cognition:24006}     SENSATION: {sensation:27233}  MUSCLE LENGTH: Hamstrings: Right *** deg; Left *** deg Thomas test: Right *** deg; Left *** deg  POSTURE: {posture:25561}  PALPATION: ***  LUMBAR ROM:   AROM eval  Flexion   Extension   Right lateral flexion   Left lateral flexion   Right  rotation   Left rotation    (Blank rows = not tested)  LOWER EXTREMITY ROM:     {AROM/PROM:27142}  Right eval Left eval  Hip flexion    Hip extension    Hip abduction    Hip adduction    Hip internal rotation    Hip external rotation    Knee flexion    Knee extension    Ankle dorsiflexion    Ankle plantarflexion    Ankle inversion    Ankle eversion     (Blank rows = not tested)  LOWER EXTREMITY MMT:    MMT Right eval Left eval  Hip flexion    Hip extension    Hip abduction    Hip adduction    Hip internal rotation    Hip external rotation    Knee flexion    Knee extension    Ankle dorsiflexion    Ankle plantarflexion    Ankle inversion    Ankle eversion     (Blank rows = not tested)  LUMBAR SPECIAL TESTS:  {lumbar special test:25242}  FUNCTIONAL TESTS:  {Functional tests:24029}  GAIT: Distance walked: *** Assistive device utilized: {Assistive devices:23999} Level of assistance: {Levels of assistance:24026} Comments: ***  TODAY'S TREATMENT:                                                                                                                              DATE: ***    PATIENT EDUCATION:  Education details: *** Person educated: {Person educated:25204} Education method: {Education Method:25205} Education comprehension: {Education Comprehension:25206}  HOME EXERCISE PROGRAM: ***  ASSESSMENT:  CLINICAL IMPRESSION: Patient is a *** y.o. *** who was seen today for physical therapy evaluation and treatment for ***.   OBJECTIVE IMPAIRMENTS: {opptimpairments:25111}.   ACTIVITY LIMITATIONS: {activitylimitations:27494}  PARTICIPATION LIMITATIONS: {participationrestrictions:25113}  PERSONAL FACTORS: {Personal factors:25162} are also affecting patient's functional outcome.  REHAB POTENTIAL: {rehabpotential:25112}  CLINICAL DECISION MAKING: {clinical decision making:25114}  EVALUATION COMPLEXITY: {Evaluation  complexity:25115}   GOALS: Goals reviewed with patient? {yes/no:20286}  SHORT TERM GOALS: Target date: ***  *** Baseline: Goal status: {GOALSTATUS:25110}  2.  *** Baseline:  Goal status: {GOALSTATUS:25110}  3.  *** Baseline:  Goal status: {GOALSTATUS:25110}  4.  *** Baseline:  Goal status: {GOALSTATUS:25110}  5.  *** Baseline:  Goal status: {GOALSTATUS:25110}  6.  *** Baseline:  Goal status: {GOALSTATUS:25110}  LONG TERM GOALS: Target date: ***  *** Baseline:  Goal status: {GOALSTATUS:25110}  2.  *** Baseline:  Goal status: {GOALSTATUS:25110}  3.  *** Baseline:  Goal status: {GOALSTATUS:25110}  4.  *** Baseline:  Goal status: {GOALSTATUS:25110}  5.  *** Baseline:  Goal status: {GOALSTATUS:25110}  6.  *** Baseline:  Goal status: {GOALSTATUS:25110}  PLAN:  PT FREQUENCY: {rehab frequency:25116}  PT DURATION: {rehab duration:25117}  PLANNED INTERVENTIONS: {rehab planned interventions:25118::"Therapeutic exercises","Therapeutic activity","Neuromuscular re-education","Balance training","Gait training","Patient/Family education","Self Care","Joint mobilization"}.  PLAN FOR NEXT SESSION: ***   Zeb Comfort, Student-PT 03/04/2022, 8:29 AM

## 2022-03-07 ENCOUNTER — Ambulatory Visit: Payer: Medicare Other | Attending: Physical Medicine and Rehabilitation | Admitting: Physical Therapy

## 2022-03-07 ENCOUNTER — Encounter: Payer: Self-pay | Admitting: Physical Therapy

## 2022-03-07 ENCOUNTER — Other Ambulatory Visit: Payer: Self-pay

## 2022-03-07 DIAGNOSIS — M25561 Pain in right knee: Secondary | ICD-10-CM | POA: Insufficient documentation

## 2022-03-07 DIAGNOSIS — M5459 Other low back pain: Secondary | ICD-10-CM | POA: Diagnosis present

## 2022-03-07 DIAGNOSIS — G8929 Other chronic pain: Secondary | ICD-10-CM | POA: Insufficient documentation

## 2022-03-07 DIAGNOSIS — R293 Abnormal posture: Secondary | ICD-10-CM | POA: Insufficient documentation

## 2022-03-07 DIAGNOSIS — M6281 Muscle weakness (generalized): Secondary | ICD-10-CM | POA: Insufficient documentation

## 2022-03-07 DIAGNOSIS — R2689 Other abnormalities of gait and mobility: Secondary | ICD-10-CM | POA: Diagnosis present

## 2022-03-08 NOTE — Therapy (Signed)
OUTPATIENT PHYSICAL THERAPY THORACOLUMBAR EVALUATION   Patient Name: Kristin Banks MRN: [id number] DOB:07-24-1945, 76 y.o., female Today's Date: 03/09/2022  END OF SESSION:  PT End of Session - 03/09/22 0931     Visit Number 2    Date for PT Re-Evaluation 04/18/22    Authorization Type Medicare & [employer]    PT Start Time [phone]    PT Stop Time 1016    PT Time Calculation (min) 44 min    Activity Tolerance Patient tolerated treatment well    Behavior During Therapy [facility] for tasks assessed/performed              Past Medical History:  Diagnosis Date   Allergy    rhinitis   Cholelithiasis    simple hepatic cyst as per [place] 01/2006   Colon polyps    PMH of X 4 since 1989,adenomatous& hyperplastic   Family history of breast cancer 12/21/2021   Family history of ovarian cancer 12/21/2021   Family history of pancreatic cancer 12/21/2021   GERD (gastroesophageal reflux disease)    Barretts esophagus;pmh of,h pylori gastritis treated 2008   Hyperlipidemia    LDLgoal=<90(ideally)based on NMR Lipoprofile 38/12/1749   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   Thyroid disease    hypothyroidism   Vitamin D deficiency    Past Surgical History:  Procedure Laterality Date   arthroscopy for meniscal tear  2007   COLONOSCOPY W/ POLYPECTOMY  2004   x4; negative 2008   lymph node resection  1973   L neck, benign   UPPER GI ENDOSCOPY     Dr Ladean Raya, [facility]   Patient Active Problem List   Diagnosis Date Noted   Monoallelic mutation of ATM gene, monoallelic mutation of WCHE52 gene 12/21/2021   Family history of pancreatic cancer 12/21/2021   Family history of ovarian cancer 12/21/2021   Family history of breast cancer 12/21/2021   Genetic testing 05/27/2020   Reduced libido 03/13/2018   Mouth pain 03/24/2017   Ganglion cyst of wrist, left 11/25/2016   Precordial pain 77/82/4235   Systolic hypertension 36/14/4315   Crushing injury of right thumb 11/20/2015    Sleep apnea 03/06/2012   PTOSIS 05/20/2010   FACIAL PARESTHESIA, LEFT 05/20/2010   MICROSCOPIC HEMATURIA 03/19/2010   VITAMIN D DEFICIENCY 10/21/2009   ANEMIA 10/21/2009   ARTHRALGIA 40/10/6759   METABOLIC SYNDROME X 95/11/3265   POSTMENOPAUSAL SYNDROME 07/08/2009   HYPOTHYROIDISM 12/12/2008   HYPERLIPIDEMIA 12/12/2008   ALLERGIC RHINITIS 12/12/2008   GERD 12/12/2008   BARRETTS ESOPHAGUS 12/12/2008   CHOLELITHIASIS 12/12/2008   COLONIC POLYPS, HX OF 12/12/2008    PCP: Red Christians, MD  REFERRING PROVIDER: Laroy Apple, MD  REFERRING DIAG:  M43.06 (ICD-10-CM) - 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 Had pain this morning in knee but now gone.  Do 5XSTS and LEFS  PERTINENT HISTORY:  L neck lymph node resection, GERD, L facial paresthesia, metabolic syndrome  PAIN:  Are you having pain? Yes: NPRS scale: 0/10 Pain location: Bil low pain Pain description: achy Aggravating factors: lifting, carrying groceries Relieving factors: heat, ice  Are you having pain? Yes: NPRS scale: 0 today/10 Pain location: anterior knee pain for both BLE, R LE pain on lateral side Pain description: ache, throbbing, radiating up upper thigh Aggravating factors: standing, lifting Relieving factors: elevate, walking, stationary bike  PRECAUTIONS: None  WEIGHT BEARING RESTRICTIONS: No  FALLS:  Has patient fallen in last 6 months?  No  LIVING ENVIRONMENT: Lives with: lives with their family and lives alone Lives in: House/apartment Stairs: Yes: Internal: 14 steps; on right going up and can reach both Has following equipment at home: None  OCCUPATION: retired  PLOF: Independent and Leisure: sewing, walking in neighborhood  PATIENT GOALS: "She wants to decrease her knee pain and wear high heels"   OBJECTIVE:   DIAGNOSTIC FINDINGS:  N/A  PATIENT SURVEYS:  Modified Oswestry 14 / 50 = 28.0 %  LEFS 52 / 80 = 65.0 %  SCREENING FOR RED FLAGS: Bowel or bladder incontinence: No Spinal tumors: No Cauda equina syndrome: No Compression fracture: No Abdominal aneurysm: No  COGNITION: Overall cognitive status: Within functional limits for tasks assessed     SENSATION: Not tested  MUSCLE LENGTH: Hamstrings: WFL ITB: WFL Piriformis: NT Hip flexors: mod tightness BLE Quads: mod tightness BLE Heelcord: NT Hip ER: limited R > L   POSTURE: increased tibial ER on R leg, increased pronation on R foot, R LE valgus  PALPATION: Hypomobility and tenderness along L1-L5, pain with palpation of R lateral knee and R quad tendon  LUMBAR ROM:   AROM eval  Flexion WNL  Extension 50% limited  Right lateral flexion 50% limited*  Left lateral flexion 50% limited*  Right rotation WNL  Left rotation WNL   (Blank rows = not tested, *=pain)  LOWER EXTREMITY ROM:     Active  Right eval Left eval  Hip flexion    Hip extension    Hip abduction    Hip adduction    Hip internal rotation    Hip external rotation    Knee flexion 130 135  Knee extension 0 0  Ankle dorsiflexion    Ankle plantarflexion    Ankle inversion    Ankle eversion     (Blank rows = grossly WFL)  LOWER EXTREMITY MMT:    MMT Right eval Left eval  Hip flexion 4- 4+  Hip extension 3 3  Hip abduction 3+ 4  Hip adduction 4- 4-  Hip internal rotation 4 4  Hip external rotation 4 4  Knee flexion 4+* 4+*  Knee extension 4+* 4+*   Ankle dorsiflexion 5 5  Ankle plantarflexion    Ankle inversion    Ankle eversion     (Blank rows = not tested, *=pain)  FUNCTIONAL TESTS: 03/09/22 5 times sit to stand: 12.82 sec Functional gait assessment: TBD  GAIT: Distance walked: 90 ft Assistive device utilized: None Level of assistance: Complete Independence Comments: increased R foot pronation and out toeing and R tibial ER, R LE valgus  TODAY'S TREATMENT:  DATE:  03/09/22 LEFS AND 5XSTS completed Therapeutic Exercise: to improve flexibility, strength and mobility.  Verbal and tactile cues throughout for technique Supine quad stretch with strap 2x30 sec bil Supine piriformis stretch 2x30 sec ea 2 versions TrAb with pelvic floor 10 x 5 sec hold Bridge with pelvic floor x 10  Therapeutic Activities: Body mechanics:  Went throught sit to stand and sit <> supine transfers, Reviewed most of body mechanics handout (still need to do last two pages and lifting techniques), discussed how to add weights to sit to stands to make more challenging.    03/07/22  Initial eval Supine hip flexor/quad stretch, modified thomas position R 1x30 sec hold Seated lumbar flexion stretch 1x30 sec hold  PATIENT EDUCATION:  Education details: HEP update and posture and body mechanics for typical daily postioning, mobility and household tasks  Person educated: Patient Education method: Explanation, Demonstration, and Handouts Education comprehension: verbalized understanding and returned demonstration  HOME EXERCISE PROGRAM: Access Code: [email or web address] URL: [email or web address] Date: 03/09/2022 Prepared by: Almyra Free  Exercises - Seated Flexion Stretch  - 1 x daily - 7 x weekly - 4-5 reps - 20 sec hold - Supine Quadriceps Stretch with Strap on Table  - 2 x daily - 7 x weekly - 2 sets - 30 sec hold - Supine  Piriformis Stretch with Foot on Ground  - 2 x daily - 7 x weekly - 1 sets - 3 reps - 30 sec hold - Supine Transversus Abdominis Bracing with Pelvic Floor Contraction  - 3 x daily - 7 x weekly - 1 sets - 10 reps - 5 sec hold - Supine Bridge with Pelvic Floor Contraction  - 2 x daily - 7 x weekly - 1 sets - 10 reps - Sit to Stand  - 3 x daily - 7 x weekly - 1-2 sets - 10 reps  Patient Education - Posture and Body Mechanics    ASSESSMENT:  CLINICAL IMPRESSION: Gena tolerated therex well today. She had difficulty with TrAb contractoin but was able to engage with pelvic floor. Use pelvic floor contraction with bridge also decreased her pain and made bed mobility easier. Patient very interested in functional improvement and likes explanation of what each exercise is for and how it will benefit her. She scored 12.82 sec on 5xSTS meeting her LTG. LEFS was also completed.    OBJECTIVE IMPAIRMENTS: Abnormal gait, decreased balance, decreased cognition, decreased coordination, decreased endurance, decreased knowledge of condition, decreased mobility, difficulty walking, decreased ROM, decreased strength, decreased safety awareness, hypomobility, increased fascial restrictions, impaired perceived functional ability, impaired flexibility, impaired sensation, improper body mechanics, postural dysfunction, and pain.   ACTIVITY LIMITATIONS: carrying, lifting, bending, standing, squatting, stairs, transfers, and locomotion level  PARTICIPATION LIMITATIONS: meal prep, cleaning, laundry, driving, shopping, community activity, and walking around neighborhood  PERSONAL FACTORS: Age, Fitness, Past/current experiences, Time since onset of injury/illness/exacerbation, and 3+ comorbidities: L neck lymph node resection, GERD, L facial paresthesia, metabolic syndrome  are also affecting patient's functional outcome.   REHAB POTENTIAL: Good  CLINICAL DECISION MAKING: Stable/uncomplicated  EVALUATION COMPLEXITY:  Low   GOALS: Goals reviewed with patient? Yes  SHORT TERM GOALS: Target date: 03/28/22  Pt will be independent with initial HEP.  Baseline: Goal status: INITIAL  LONG TERM GOALS: Target date: 04/18/22  Pt will be independent with advanced/ongoing HEP to improve outcomes/carryover.  Baseline:  Goal status: INITIAL  2.  Pt will report at least 75% improvement in knee and back pain to improve QOL.  Baseline:  Goal status: INITIAL  3.  Pt will score </= 22% on Modified Oswestry to demonstrate improved functional ability. Baseline: 14 / 50 = 28.0 % Goal status: INITIAL  4.  Pt will amb >15 minutes with normal gait pattern without increased pain to access community or perform leisure activity of walking in neighborhood. Baseline:  Goal status: INITIAL  5.  Pt will demonstrate improvement in BLE strength >/= 4/5 with reduced pain to increase mobility and stability.  Baseline:  Goal status: INITIAL  6.  Pt will be able to ascend/descend stairs with 1 HR and reciprocal step pattern safely to access home and community.    Baseline:  Goal status: INITIAL  7.  Pt will score <20 sec on 5xSTS to improve risk for falls and increase functional strength to improve QOL.  Baseline: 12.82 sec on 03/09/22 Goal status: MET  8.  Pt will report an improve LEFS score by 9 points to demonstrate improved functional ability.   Baseline: 52 / 80 = 65.0 % on 03/09/22 Goal status: INITIAL    PLAN:  PT FREQUENCY: 2x/week  PT DURATION: 6 weeks  PLANNED INTERVENTIONS: Therapeutic exercises, Therapeutic activity, Neuromuscular re-education, Balance training, Gait training, Patient/Family education, Self Care, Joint mobilization, Joint manipulation, Stair training, Aquatic Therapy, Dry Needling, Electrical stimulation, Spinal manipulation, Spinal mobilization, Cryotherapy, Moist heat, Vasopneumatic device, Traction, Ultrasound, Ionotophoresis 36m/ml Dexamethasone, Manual therapy, and  Re-evaluation.  PLAN FOR NEXT SESSION: assess response to aquatic therapy, review initial HEP, perform 5xSTS/LEFS, body posturing/mechanics handout, proximal hip muscle strengthening/flexibility, lumbar flexion preference   Kristin Banks, PT 03/09/2022, 12:14 PM

## 2022-03-09 ENCOUNTER — Ambulatory Visit: Payer: Medicare Other | Admitting: Rehabilitative and Restorative Service Providers"

## 2022-03-09 ENCOUNTER — Ambulatory Visit (HOSPITAL_BASED_OUTPATIENT_CLINIC_OR_DEPARTMENT_OTHER): Payer: Medicare Other | Admitting: Physical Therapy

## 2022-03-09 ENCOUNTER — Ambulatory Visit: Payer: Medicare Other | Admitting: Physical Therapy

## 2022-03-09 ENCOUNTER — Encounter: Payer: Self-pay | Admitting: Physical Therapy

## 2022-03-09 DIAGNOSIS — R293 Abnormal posture: Secondary | ICD-10-CM

## 2022-03-09 DIAGNOSIS — M5459 Other low back pain: Secondary | ICD-10-CM

## 2022-03-09 DIAGNOSIS — R2689 Other abnormalities of gait and mobility: Secondary | ICD-10-CM

## 2022-03-09 DIAGNOSIS — M25561 Pain in right knee: Secondary | ICD-10-CM | POA: Diagnosis not present

## 2022-03-09 DIAGNOSIS — M6281 Muscle weakness (generalized): Secondary | ICD-10-CM

## 2022-03-09 DIAGNOSIS — G8929 Other chronic pain: Secondary | ICD-10-CM

## 2022-03-14 ENCOUNTER — Ambulatory Visit: Payer: Medicare Other

## 2022-03-14 DIAGNOSIS — R2689 Other abnormalities of gait and mobility: Secondary | ICD-10-CM

## 2022-03-14 DIAGNOSIS — M25561 Pain in right knee: Secondary | ICD-10-CM | POA: Diagnosis not present

## 2022-03-14 DIAGNOSIS — M5459 Other low back pain: Secondary | ICD-10-CM

## 2022-03-14 DIAGNOSIS — R293 Abnormal posture: Secondary | ICD-10-CM

## 2022-03-14 DIAGNOSIS — G8929 Other chronic pain: Secondary | ICD-10-CM

## 2022-03-14 DIAGNOSIS — M6281 Muscle weakness (generalized): Secondary | ICD-10-CM

## 2022-03-14 NOTE — Therapy (Signed)
OUTPATIENT PHYSICAL THERAPY TREATMENT   Patient Name: Kristin Banks MRN: [id number] DOB:02-14-1946, 76 y.o., female Today's Date: 03/14/2022  END OF SESSION:  PT End of Session - 03/14/22 1451     Visit Number 3    Date for PT Re-Evaluation 04/18/22    Authorization Type Medicare & [employer]    PT Start Time 5284    PT Stop Time 1529    PT Time Calculation (min) 44 min    Activity Tolerance Patient tolerated treatment well    Behavior During Therapy WFL for tasks assessed/performed               Past Medical History:  Diagnosis Date   Allergy    rhinitis   Cholelithiasis    simple hepatic cyst as per [place] 01/2006   Colon polyps    PMH of X 4 since 1989,adenomatous& hyperplastic   Family history of breast cancer 12/21/2021   Family history of ovarian cancer 12/21/2021   Family history of pancreatic cancer 12/21/2021   GERD (gastroesophageal reflux disease)    Barretts esophagus;pmh of,h pylori gastritis treated 2008   Hyperlipidemia    LDLgoal=<90(ideally)based on NMR Lipoprofile 13/24/4010   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   Thyroid disease    hypothyroidism   Vitamin D deficiency    Past Surgical History:  Procedure Laterality Date   arthroscopy for meniscal tear  2007   COLONOSCOPY W/ POLYPECTOMY  2004   x4; negative 2008   lymph node resection  1973   L neck, benign   UPPER GI ENDOSCOPY     Dr Ladean Raya, [facility]   Patient Active Problem List   Diagnosis Date Noted   Monoallelic mutation of ATM gene, monoallelic mutation of [id number] gene 12/21/2021   Family history of pancreatic cancer 12/21/2021   Family history of ovarian cancer 12/21/2021   Family history of breast cancer 12/21/2021   Genetic testing 05/27/2020   Reduced libido 03/13/2018   Mouth pain 03/24/2017   Ganglion cyst of wrist, left 11/25/2016   Precordial pain 64/40/3474   Systolic hypertension 25/95/6387   Crushing injury of right thumb 11/20/2015   Sleep apnea  03/06/2012   PTOSIS 05/20/2010   FACIAL PARESTHESIA, LEFT 05/20/2010   MICROSCOPIC HEMATURIA 03/19/2010   VITAMIN D DEFICIENCY 10/21/2009   ANEMIA 10/21/2009   ARTHRALGIA 56/43/3295   METABOLIC SYNDROME X 18/84/1660   POSTMENOPAUSAL SYNDROME 07/08/2009   HYPOTHYROIDISM 12/12/2008   HYPERLIPIDEMIA 12/12/2008   ALLERGIC RHINITIS 12/12/2008   GERD 12/12/2008   BARRETTS ESOPHAGUS 12/12/2008   CHOLELITHIASIS 12/12/2008   COLONIC POLYPS, HX OF 12/12/2008    PCP: Red Christians, MD  REFERRING PROVIDER: Laroy Apple, MD  REFERRING DIAG:  M43.06 (ICD-10-CM) - 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 Yes: NPRS scale: 5/10 Pain location: Bil low pain Pain description: achy Aggravating factors: lifting, carrying groceries Relieving factors: heat, ice  Are you having pain? Yes: NPRS scale: 3/10 Pain location: anterior knee pain for both BLE, R LE pain on lateral side Pain description: ache, throbbing, radiating up upper thigh Aggravating factors: standing, lifting Relieving factors: elevate, walking, stationary bike  PRECAUTIONS: None  WEIGHT BEARING RESTRICTIONS: No  FALLS:  Has patient fallen in last 6 months? No  LIVING ENVIRONMENT: Lives with: lives with their family and lives alone Lives in:  House/apartment Stairs: Yes: Internal: 14 steps; on right going up and can reach both Has following equipment at home: None  OCCUPATION: retired  PLOF: Independent and Leisure: sewing, walking in neighborhood  PATIENT GOALS: "She wants to decrease her knee pain and wear high heels"   OBJECTIVE:   DIAGNOSTIC FINDINGS:  N/A  PATIENT SURVEYS:  Modified Oswestry 14 / 50 = 28.0 %  LEFS 52 / 80 = 65.0 %  SCREENING FOR RED FLAGS: Bowel or bladder incontinence: No Spinal tumors: No Cauda equina syndrome: No Compression fracture: No Abdominal aneurysm: No  COGNITION: Overall cognitive status: Within functional limits for tasks assessed     SENSATION: Not tested  MUSCLE LENGTH: Hamstrings: WFL ITB: WFL Piriformis: NT Hip flexors: mod tightness BLE Quads: mod tightness BLE Heelcord: NT Hip ER: limited R > L   POSTURE: increased tibial ER on R leg, increased pronation on R foot, R LE valgus  PALPATION: Hypomobility and tenderness along L1-L5, pain with palpation of R lateral knee and R quad tendon  LUMBAR ROM:   AROM eval  Flexion WNL  Extension 50% limited  Right lateral flexion 50% limited*  Left lateral flexion 50% limited*  Right rotation WNL  Left rotation WNL   (Blank rows = not tested, *=pain)  LOWER EXTREMITY ROM:     Active  Right eval Left eval  Hip flexion    Hip extension    Hip abduction    Hip adduction    Hip internal rotation    Hip external rotation    Knee flexion 130 135  Knee extension 0 0  Ankle dorsiflexion    Ankle plantarflexion    Ankle inversion    Ankle eversion     (Blank rows = grossly WFL)  LOWER EXTREMITY MMT:    MMT Right eval Left eval  Hip flexion 4- 4+  Hip extension 3 3  Hip abduction 3+ 4  Hip adduction 4- 4-  Hip internal rotation 4 4  Hip external rotation 4 4  Knee flexion 4+* 4+*  Knee extension 4+* 4+*  Ankle dorsiflexion 5 5  Ankle plantarflexion    Ankle inversion    Ankle eversion      (Blank rows = not tested, *=pain)  FUNCTIONAL TESTS: 03/09/22 5 times sit to stand: 12.82 sec Functional gait assessment: TBD  GAIT: Distance walked: 90 ft Assistive device utilized: None Level of assistance: Complete Independence Comments: increased R foot pronation and out toeing and R tibial ER, R LE valgus  TODAY'S TREATMENT:  DATE:   03/14/22 Therapeutic Exercise: to improve flexibility, strength and mobility.  Verbal and tactile cues throughout for technique Rec Bike L1x34mn Seated heel slides x 10 RLE Seated lumbar flexion stretch orange pball 3x15 sec Seated TrA set with orange pball x 10 5 sec hold Supine TrA brace /PPT x 10 5 sec Hooklying clams 1 set no weight: 2nd set RTB x 10 5 sec hold bil Supine march x 10 5 sec hold bil RTB Standing pallof press RTB x 20  03/09/22 LEFS AND 5XSTS completed Therapeutic Exercise: to improve flexibility, strength and mobility.  Verbal and tactile cues throughout for technique Supine quad stretch with strap 2x30 sec bil Supine piriformis stretch 2x30 sec ea 2 versions TrAb with pelvic floor 10 x 5 sec hold Bridge with pelvic floor x 10  Therapeutic Activities: Body mechanics:  Went throught sit to stand and sit <> supine transfers, Reviewed most of body mechanics handout (still need to do last two pages and lifting techniques), discussed how to add weights to sit to stands to make more challenging.    03/07/22  Initial eval Supine hip flexor/quad stretch, modified thomas position R 1x30 sec hold Seated lumbar flexion stretch 1x30 sec hold  PATIENT EDUCATION:  Education details: HEP update and posture and body mechanics for typical daily postioning, mobility and household tasks  Person educated: Patient Education method: Explanation, Demonstration, and Handouts Education comprehension: verbalized  understanding and returned demonstration  HOME EXERCISE PROGRAM: Access Code: [email or web address]: [email or web address] Date: 03/09/2022 Prepared by: JAlmyra Free Exercises - Seated Flexion Stretch  - 1 x daily - 7 x weekly - 4-5 reps - 20 sec hold - Supine Quadriceps Stretch with Strap on Table  - 2 x daily - 7 x weekly - 2 sets - 30 sec hold - Supine Piriformis Stretch with Foot on Ground  - 2 x daily - 7 x weekly - 1 sets - 3 reps - 30 sec hold - Supine Transversus Abdominis Bracing with Pelvic Floor Contraction  - 3 x daily - 7 x weekly - 1 sets - 10 reps - 5 sec hold - Supine Bridge with Pelvic Floor Contraction  - 2 x daily - 7 x weekly - 1 sets - 10 reps - Sit to Stand  - 3 x daily - 7 x weekly - 1-2 sets - 10 reps  Patient Education - Posture and Body Mechanics    ASSESSMENT:  CLINICAL IMPRESSION: Continued with progression of exercises to improve core stabilization. She had some issues with lying supine d/t some increased pain, after doing exercises the pain eased off. Updated HEP today to progress hip strengthening to provide more lumbar support. We reviewed the log roll technique to reduce strain on her back when going from supine to sit. Pt responded well to treatment.    OBJECTIVE IMPAIRMENTS: Abnormal gait, decreased balance, decreased cognition, decreased coordination, decreased endurance, decreased knowledge of condition, decreased mobility, difficulty walking, decreased ROM, decreased strength, decreased safety awareness, hypomobility, increased fascial restrictions, impaired perceived functional ability, impaired flexibility, impaired sensation, improper body mechanics, postural dysfunction, and pain.   ACTIVITY LIMITATIONS: carrying, lifting, bending, standing, squatting, stairs, transfers, and locomotion level  PARTICIPATION LIMITATIONS: meal prep, cleaning, laundry, driving, shopping, community activity, and walking around neighborhood  PERSONAL FACTORS: Age,  Fitness, Past/current experiences, Time since onset of injury/illness/exacerbation, and 3+ comorbidities: L neck lymph node resection, GERD, L facial paresthesia, metabolic syndrome  are also affecting patient's functional outcome.   REHAB POTENTIAL: Good  CLINICAL  DECISION MAKING: Stable/uncomplicated  EVALUATION COMPLEXITY: Low   GOALS: Goals reviewed with patient? Yes  SHORT TERM GOALS: Target date: 03/28/22  Pt will be independent with initial HEP.  Baseline: Goal status: IN PROGRESS  LONG TERM GOALS: Target date: 04/18/22  Pt will be independent with advanced/ongoing HEP to improve outcomes/carryover.  Baseline:  Goal status: IN PROGRESS  2.  Pt will report at least 75% improvement in knee and back pain to improve QOL.  Baseline:  Goal status: IN PROGRESS  3.  Pt will score </= 22% on Modified Oswestry to demonstrate improved functional ability. Baseline: 14 / 50 = 28.0 % Goal status: IN PROGRESS  4.  Pt will amb >15 minutes with normal gait pattern without increased pain to access community or perform leisure activity of walking in neighborhood. Baseline:  Goal status: IN PROGRESS  5.  Pt will demonstrate improvement in BLE strength >/= 4/5 with reduced pain to increase mobility and stability.  Baseline:  Goal status: IN PROGRESS  6.  Pt will be able to ascend/descend stairs with 1 HR and reciprocal step pattern safely to access home and community.    Baseline:  Goal status: IN PROGRESS  7.  Pt will score <20 sec on 5xSTS to improve risk for falls and increase functional strength to improve QOL.  Baseline: 12.82 sec on 03/09/22 Goal status: MET  8.  Pt will report an improve LEFS score by 9 points to demonstrate improved functional ability.   Baseline: 52 / 80 = 65.0 % on 03/09/22 Goal status: IN PROGRESS    PLAN:  PT FREQUENCY: 2x/week  PT DURATION: 6 weeks  PLANNED INTERVENTIONS: Therapeutic exercises, Therapeutic activity, Neuromuscular  re-education, Balance training, Gait training, Patient/Family education, Self Care, Joint mobilization, Joint manipulation, Stair training, Aquatic Therapy, Dry Needling, Electrical stimulation, Spinal manipulation, Spinal mobilization, Cryotherapy, Moist heat, Vasopneumatic device, Traction, Ultrasound, Ionotophoresis 67m/ml Dexamethasone, Manual therapy, and Re-evaluation.  PLAN FOR NEXT SESSION: assess response to aquatic therapy, review initial HEP, perform 5xSTS/LEFS, body posturing/mechanics handout, proximal hip muscle strengthening/flexibility, lumbar flexion preference   BArtist Pais PTA 03/14/2022, 3:31 PM

## 2022-03-16 ENCOUNTER — Ambulatory Visit: Payer: Medicare Other

## 2022-03-16 ENCOUNTER — Ambulatory Visit
Admission: RE | Admit: 2022-03-16 | Discharge: 2022-03-16 | Disposition: A | Discharge status: Home / Self Care | Payer: Medicare Other | Source: Ambulatory Visit | Attending: Internal Medicine | Admitting: Internal Medicine

## 2022-03-16 DIAGNOSIS — Z1231 Encounter for screening mammogram for malignant neoplasm of breast: Secondary | ICD-10-CM

## 2022-03-16 DIAGNOSIS — M6281 Muscle weakness (generalized): Secondary | ICD-10-CM

## 2022-03-16 DIAGNOSIS — G8929 Other chronic pain: Secondary | ICD-10-CM

## 2022-03-16 DIAGNOSIS — R2689 Other abnormalities of gait and mobility: Secondary | ICD-10-CM

## 2022-03-16 DIAGNOSIS — M5459 Other low back pain: Secondary | ICD-10-CM

## 2022-03-16 DIAGNOSIS — M25561 Pain in right knee: Secondary | ICD-10-CM | POA: Diagnosis not present

## 2022-03-16 DIAGNOSIS — R293 Abnormal posture: Secondary | ICD-10-CM

## 2022-03-16 NOTE — Therapy (Signed)
OUTPATIENT PHYSICAL THERAPY TREATMENT   Patient Name: Kristin Banks MRN: [id number] DOB:[birth year], 76 y.o., female Today's Date: 03/16/2022  END OF SESSION:  PT End of Session - 03/16/22 1449     Visit Number 4    Date for PT Re-Evaluation 04/18/22    Authorization Type Medicare & [employer]    PT Start Time 0263    PT Stop Time 7858    PT Time Calculation (min) 42 min    Activity Tolerance Patient tolerated treatment well    Behavior During Therapy WFL for tasks assessed/performed               Past Medical History:  Diagnosis Date   Allergy    rhinitis   Cholelithiasis    simple hepatic cyst as per [place] 01/2006   Colon polyps    PMH of X 4 since 1989,adenomatous& hyperplastic   Family history of breast cancer 12/21/2021   Family history of ovarian cancer 12/21/2021   Family history of pancreatic cancer 12/21/2021   GERD (gastroesophageal reflux disease)    Barretts esophagus;pmh of,h pylori gastritis treated 2008   Hyperlipidemia    LDLgoal=<90(ideally)based on NMR Lipoprofile 85/04/7739   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   Thyroid disease    hypothyroidism   Vitamin D deficiency    Past Surgical History:  Procedure Laterality Date   arthroscopy for meniscal tear  2007   COLONOSCOPY W/ POLYPECTOMY  2004   x4; negative 2008   lymph node resection  1973   L neck, benign   UPPER GI ENDOSCOPY     Dr Ladean Raya, [facility]   Patient Active Problem List   Diagnosis Date Noted   Monoallelic mutation of ATM gene, monoallelic mutation of OINO67 gene 12/21/2021   Family history of pancreatic cancer 12/21/2021   Family history of ovarian cancer 12/21/2021   Family history of breast cancer 12/21/2021   Genetic testing 05/27/2020   Reduced libido 03/13/2018   Mouth pain 03/24/2017   Ganglion cyst of wrist, left 11/25/2016   Precordial pain 67/20/9470   Systolic hypertension 96/28/3662   Crushing injury of right thumb 11/20/2015   Sleep apnea  03/06/2012   PTOSIS 05/20/2010   FACIAL PARESTHESIA, LEFT 05/20/2010   MICROSCOPIC HEMATURIA 03/19/2010   VITAMIN D DEFICIENCY 10/21/2009   ANEMIA 10/21/2009   ARTHRALGIA 94/76/5465   METABOLIC SYNDROME X 03/54/6568   POSTMENOPAUSAL SYNDROME 07/08/2009   HYPOTHYROIDISM 12/12/2008   HYPERLIPIDEMIA 12/12/2008   ALLERGIC RHINITIS 12/12/2008   GERD 12/12/2008   BARRETTS ESOPHAGUS 12/12/2008   CHOLELITHIASIS 12/12/2008   COLONIC POLYPS, HX OF 12/12/2008    PCP: Red Christians, MD  REFERRING PROVIDER: Laroy Apple, MD  REFERRING DIAG:  M43.06 (ICD-10-CM) - 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 Yes: NPRS scale: 0/10 Pain location: Bil low pain Pain description: achy Aggravating factors: lifting, carrying groceries Relieving factors: heat, ice  Are you having pain? Yes: NPRS scale: 3/10 Pain location: anterior knee pain for both BLE, R LE pain on lateral side Pain description: ache, throbbing, radiating up upper thigh Aggravating factors: standing, lifting Relieving factors: elevate, walking, stationary bike  PRECAUTIONS: None  WEIGHT BEARING RESTRICTIONS: No  FALLS:  Has patient fallen in last 6 months? No  LIVING  ENVIRONMENT: Lives with: lives with their family and lives alone Lives in: House/apartment Stairs: Yes: Internal: 14 steps; on right going up and can reach both Has following equipment at home: None  OCCUPATION: retired  PLOF: Independent and Leisure: sewing, walking in neighborhood  PATIENT GOALS: "She wants to decrease her knee pain and wear high heels"   OBJECTIVE:   DIAGNOSTIC FINDINGS:  N/A  PATIENT SURVEYS:  Modified Oswestry 14 / 50 = 28.0 %  LEFS 52 / 80 = 65.0 %  SCREENING FOR RED FLAGS: Bowel or bladder incontinence: No Spinal tumors: No Cauda equina syndrome: No Compression fracture: No Abdominal aneurysm: No  COGNITION: Overall cognitive status: Within functional limits for tasks assessed     SENSATION: Not tested  MUSCLE LENGTH: Hamstrings: WFL ITB: WFL Piriformis: NT Hip flexors: mod tightness BLE Quads: mod tightness BLE Heelcord: NT Hip ER: limited R > L   POSTURE: increased tibial ER on R leg, increased pronation on R foot, R LE valgus  PALPATION: Hypomobility and tenderness along L1-L5, pain with palpation of R lateral knee and R quad tendon  LUMBAR ROM:   AROM eval  Flexion WNL  Extension 50% limited  Right lateral flexion 50% limited*  Left lateral flexion 50% limited*  Right rotation WNL  Left rotation WNL   (Blank rows = not tested, *=pain)  LOWER EXTREMITY ROM:     Active  Right eval Left eval  Hip flexion    Hip extension    Hip abduction    Hip adduction    Hip internal rotation    Hip external rotation    Knee flexion 130 135  Knee extension 0 0  Ankle dorsiflexion    Ankle plantarflexion    Ankle inversion    Ankle eversion     (Blank rows = grossly WFL)  LOWER EXTREMITY MMT:    MMT Right eval Left eval  Hip flexion 4- 4+  Hip extension 3 3  Hip abduction 3+ 4  Hip adduction 4- 4-  Hip internal rotation 4 4  Hip external rotation 4 4  Knee flexion 4+* 4+*  Knee extension 4+* 4+*  Ankle  dorsiflexion 5 5  Ankle plantarflexion    Ankle inversion    Ankle eversion     (Blank rows = not tested, *=pain)  FUNCTIONAL TESTS: 03/09/22 5 times sit to stand: 12.82 sec Functional gait assessment: TBD  GAIT: Distance walked: 90 ft Assistive device utilized: None Level of assistance: Complete Independence Comments: increased R foot pronation and out toeing and R tibial ER, R LE valgus  TODAY'S TREATMENT:  DATE:   03/16/22 Therapeutic Exercise: to improve flexibility, strength and mobility.  Verbal and tactile cues throughout for technique Nustep L4x31mn Seated R LE heel slides with slider x 20  Supine TrA bracing 2x10 Supine bridge with TrA bracing x 10 5 sec hold Supine clams with TrA bracing 2x10 5 sec hold Standing pallof press RTB x 20 both sides Standing rows RTB 2x10 Standing lat pulls 15lb x 10  03/14/22 Therapeutic Exercise: to improve flexibility, strength and mobility.  Verbal and tactile cues throughout for technique Rec Bike L1x625m Seated heel slides x 10 RLE Seated lumbar flexion stretch orange pball 3x15 sec Seated TrA set with orange pball x 10 5 sec hold Supine TrA brace /PPT x 10 5 sec Hooklying clams 1 set no weight: 2nd set RTB x 10 5 sec hold bil Supine march x 10 5 sec hold bil RTB Standing pallof press RTB x 20  03/09/22 LEFS AND 5XSTS completed Therapeutic Exercise: to improve flexibility, strength and mobility.  Verbal and tactile cues throughout for technique Supine quad stretch with strap 2x30 sec bil Supine piriformis stretch 2x30 sec ea 2 versions TrAb with pelvic floor 10 x 5 sec hold Bridge with pelvic floor x 10  Therapeutic Activities: Body mechanics:  Went throught sit to stand and sit <> supine transfers, Reviewed most of body mechanics handout (still need to do last two pages and lifting techniques),  discussed how to add weights to sit to stands to make more challenging.    03/07/22  Initial eval Supine hip flexor/quad stretch, modified thomas position R 1x30 sec hold Seated lumbar flexion stretch 1x30 sec hold  PATIENT EDUCATION:  Education details: HEP update and posture and body mechanics for typical daily postioning, mobility and household tasks  Person educated: Patient Education method: Explanation, Demonstration, and Handouts Education comprehension: verbalized understanding and returned demonstration  HOME EXERCISE PROGRAM: Access Code: [email or web address]: [email or web address] Date: 03/09/2022 Prepared by: JuAlmyra FreeExercises - Seated Flexion Stretch  - 1 x daily - 7 x weekly - 4-5 reps - 20 sec hold - Supine Quadriceps Stretch with Strap on Table  - 2 x daily - 7 x weekly - 2 sets - 30 sec hold - Supine Piriformis Stretch with Foot on Ground  - 2 x daily - 7 x weekly - 1 sets - 3 reps - 30 sec hold - Supine Transversus Abdominis Bracing with Pelvic Floor Contraction  - 3 x daily - 7 x weekly - 1 sets - 10 reps - 5 sec hold - Supine Bridge with Pelvic Floor Contraction  - 2 x daily - 7 x weekly - 1 sets - 10 reps - Sit to Stand  - 3 x daily - 7 x weekly - 1-2 sets - 10 reps  Patient Education - Posture and Body Mechanics    ASSESSMENT:  CLINICAL IMPRESSION: Pt arrived with no new issues regarding her lower back. We were able to continue progressing core stabilization and adding in postural strengthening to avoid strain on low back. She only slightly was able to lift her buttock off table with bridges. Cues were necessary with pallof press. Updated HEP with rows and reviewed with patient.   OBJECTIVE IMPAIRMENTS: Abnormal gait, decreased balance, decreased cognition, decreased coordination, decreased endurance, decreased knowledge of condition, decreased mobility, difficulty walking, decreased ROM, decreased strength, decreased safety awareness, hypomobility,  increased fascial restrictions, impaired perceived functional ability, impaired flexibility, impaired sensation, improper body mechanics, postural dysfunction, and pain.   ACTIVITY LIMITATIONS: carrying,  lifting, bending, standing, squatting, stairs, transfers, and locomotion level  PARTICIPATION LIMITATIONS: meal prep, cleaning, laundry, driving, shopping, community activity, and walking around neighborhood  PERSONAL FACTORS: Age, Fitness, Past/current experiences, Time since onset of injury/illness/exacerbation, and 3+ comorbidities: L neck lymph node resection, GERD, L facial paresthesia, metabolic syndrome  are also affecting patient's functional outcome.   REHAB POTENTIAL: Good  CLINICAL DECISION MAKING: Stable/uncomplicated  EVALUATION COMPLEXITY: Low   GOALS: Goals reviewed with patient? Yes  SHORT TERM GOALS: Target date: 03/28/22  Pt will be independent with initial HEP.  Baseline: Goal status: IN PROGRESS  LONG TERM GOALS: Target date: 04/18/22  Pt will be independent with advanced/ongoing HEP to improve outcomes/carryover.  Baseline:  Goal status: IN PROGRESS  2.  Pt will report at least 75% improvement in knee and back pain to improve QOL.  Baseline:  Goal status: IN PROGRESS  3.  Pt will score </= 22% on Modified Oswestry to demonstrate improved functional ability. Baseline: 14 / 50 = 28.0 % Goal status: IN PROGRESS  4.  Pt will amb >15 minutes with normal gait pattern without increased pain to access community or perform leisure activity of walking in neighborhood. Baseline:  Goal status: IN PROGRESS  5.  Pt will demonstrate improvement in BLE strength >/= 4/5 with reduced pain to increase mobility and stability.  Baseline:  Goal status: IN PROGRESS  6.  Pt will be able to ascend/descend stairs with 1 HR and reciprocal step pattern safely to access home and community.    Baseline:  Goal status: IN PROGRESS  7.  Pt will score <20 sec on 5xSTS to improve  risk for falls and increase functional strength to improve QOL.  Baseline: 12.82 sec on 03/09/22 Goal status: MET  8.  Pt will report an improve LEFS score by 9 points to demonstrate improved functional ability.   Baseline: 52 / 80 = 65.0 % on 03/09/22 Goal status: IN PROGRESS    PLAN:  PT FREQUENCY: 2x/week  PT DURATION: 6 weeks  PLANNED INTERVENTIONS: Therapeutic exercises, Therapeutic activity, Neuromuscular re-education, Balance training, Gait training, Patient/Family education, Self Care, Joint mobilization, Joint manipulation, Stair training, Aquatic Therapy, Dry Needling, Electrical stimulation, Spinal manipulation, Spinal mobilization, Cryotherapy, Moist heat, Vasopneumatic device, Traction, Ultrasound, Ionotophoresis 53m/ml Dexamethasone, Manual therapy, and Re-evaluation.  PLAN FOR NEXT SESSION: assess response to aquatic therapy, review initial HEP, body posturing/mechanics handout, proximal hip muscle strengthening/flexibility, lumbar flexion preference   BArtist Pais PTA 03/16/2022, 2:50 PM

## 2022-03-23 ENCOUNTER — Ambulatory Visit: Payer: Medicare Other

## 2022-03-23 DIAGNOSIS — G8929 Other chronic pain: Secondary | ICD-10-CM

## 2022-03-23 DIAGNOSIS — M6281 Muscle weakness (generalized): Secondary | ICD-10-CM

## 2022-03-23 DIAGNOSIS — R293 Abnormal posture: Secondary | ICD-10-CM

## 2022-03-23 DIAGNOSIS — M5459 Other low back pain: Secondary | ICD-10-CM

## 2022-03-23 DIAGNOSIS — M25561 Pain in right knee: Secondary | ICD-10-CM | POA: Diagnosis not present

## 2022-03-23 DIAGNOSIS — R2689 Other abnormalities of gait and mobility: Secondary | ICD-10-CM

## 2022-03-23 NOTE — Therapy (Addendum)
OUTPATIENT PHYSICAL THERAPY TREATMENT / DISCHARGE SUMMARY   Patient Name: Kristin MaiersGinnie H Veron MRN: [id number] DOB:18-Aug-1945, 76 y.o., female Today's Date: 03/23/2022  END OF SESSION:  PT End of Session - 03/23/22 1019     Visit Number 5    Date for PT Re-Evaluation 04/18/22    Authorization Type Medicare & [employer]    PT Start Time [phone]    PT Stop Time 1017    PT Time Calculation (min) 45 min    Activity Tolerance Patient tolerated treatment well    Behavior During Therapy WFL for tasks assessed/performed                Past Medical History:  Diagnosis Date   Allergy    rhinitis   Cholelithiasis    simple hepatic cyst as per US 01/2006   Colon polyps    PMH of X 4 since 1989,adenomatous& hyperplastic   Family history of breast cancer 12/21/2021   Family history of ovarian cancer 12/21/2021   Family history of pancreatic cancer 12/21/2021   GERD (gastroesophageal reflux disease)    Barretts esophagus;pmh of,h pylori gastritis treated 2008   Hyperlipidemia    LDLgoal=<90(ideally)based on NMR Lipoprofile 03/06/2008   Monoallelic mutation of ATM gene 5/62/13089/26/2023   Thyroid disease    hypothyroidism   Vitamin D deficiency    Past Surgical History:  Procedure Laterality Date   arthroscopy for meniscal tear  2007   COLONOSCOPY W/ POLYPECTOMY  2004   x4; negative 2008   lymph node resection  1973   L neck, benign   UPPER GI ENDOSCOPY     [facility] [place] [place] [place]   Patient Active Problem List   Diagnosis Date Noted   Monoallelic mutation of ATM gene, monoallelic mutation of HOXB13 gene 12/21/2021   Family history of pancreatic cancer 12/21/2021   Family history of ovarian cancer 12/21/2021   Family history of breast cancer 12/21/2021   Genetic testing 05/27/2020   Reduced libido 03/13/2018   Mouth pain 03/24/2017   Ganglion cyst of wrist, left 11/25/2016   Precordial pain 01/07/2016   Systolic hypertension 01/07/2016   Crushing injury of right thumb  11/20/2015   Sleep apnea 03/06/2012   PTOSIS 05/20/2010   FACIAL PARESTHESIA, LEFT 05/20/2010   MICROSCOPIC HEMATURIA 03/19/2010   VITAMIN D DEFICIENCY 10/21/2009   ANEMIA 10/21/2009   ARTHRALGIA 10/16/2009   METABOLIC SYNDROME X 07/08/2009   POSTMENOPAUSAL SYNDROME 07/08/2009   HYPOTHYROIDISM 12/12/2008   HYPERLIPIDEMIA 12/12/2008   ALLERGIC RHINITIS 12/12/2008   GERD 12/12/2008   BARRETTS ESOPHAGUS 12/12/2008   CHOLELITHIASIS 12/12/2008   COLONIC POLYPS, HX OF 12/12/2008    PCP: Mauricia AreaSeltzer, Barry R, MD  REFERRING PROVIDER: Romero BellingIbazebo, [place], MD  REFERRING DIAG:  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 Her back was bothering her a lot after cooking on Sunday and a little yesterday.  PERTINENT HISTORY:  L neck lymph node resection, GERD, L facial paresthesia, metabolic syndrome  PAIN:  Are you having pain? Yes: NPRS scale: 0/10 Pain location: Bil low pain Pain description: achy Aggravating factors: lifting, carrying groceries Relieving factors: heat, ice  Are you having pain? Yes: NPRS scale: 4/10 Pain location: anterior knee pain for both BLE, R LE pain on lateral side Pain description: ache, throbbing, radiating up upper thigh Aggravating factors: standing, lifting Relieving factors: elevate, walking, stationary bike  PRECAUTIONS: None  WEIGHT  BEARING RESTRICTIONS: No  FALLS:  Has patient fallen in last 6 months? No  LIVING ENVIRONMENT: Lives with: lives with their family and lives alone Lives in: House/apartment Stairs: Yes: Internal: 14 steps; on right going up and can reach both Has following equipment at home: None  OCCUPATION: retired  PLOF: Independent and Leisure: sewing, walking in neighborhood  PATIENT GOALS: "She wants to decrease her knee pain and wear high heels"   OBJECTIVE:   DIAGNOSTIC FINDINGS:  N/A  PATIENT SURVEYS:  Modified Oswestry 14 / 50 = 28.0 %  LEFS 52 / 80 = 65.0 %  SCREENING FOR RED FLAGS: Bowel or bladder incontinence: No Spinal tumors: No Cauda equina syndrome: No Compression fracture: No Abdominal aneurysm: No  COGNITION: Overall cognitive status: Within functional limits for tasks assessed     SENSATION: Not tested  MUSCLE LENGTH: Hamstrings: WFL ITB: WFL Piriformis: NT Hip flexors: mod tightness BLE Quads: mod tightness BLE Heelcord: NT Hip ER: limited R > L   POSTURE: increased tibial ER on R leg, increased pronation on R foot, R LE valgus  PALPATION: Hypomobility and tenderness along L1-L5, pain with palpation of R lateral knee and R quad tendon  LUMBAR ROM:   AROM eval  Flexion WNL  Extension 50% limited  Right lateral flexion 50% limited*  Left lateral flexion 50% limited*  Right rotation WNL  Left rotation WNL   (Blank rows = not tested, *=pain)  LOWER EXTREMITY ROM:     Active  Right eval Left eval  Hip flexion    Hip extension    Hip abduction    Hip adduction    Hip internal rotation    Hip external rotation    Knee flexion 130 135  Knee extension 0 0  Ankle dorsiflexion    Ankle plantarflexion    Ankle inversion    Ankle eversion     (Blank rows = grossly WFL)  LOWER EXTREMITY MMT:    MMT Right eval Left eval  Hip flexion 4- 4+  Hip extension 3 3  Hip abduction 3+ 4  Hip adduction 4- 4-  Hip internal rotation 4 4  Hip  external rotation 4 4  Knee flexion 4+* 4+*  Knee extension 4+* 4+*  Ankle dorsiflexion 5 5  Ankle plantarflexion    Ankle inversion    Ankle eversion     (Blank rows = not tested, *=pain)  FUNCTIONAL TESTS: 03/09/22 5 times sit to stand: 12.82 sec Functional gait assessment: TBD  GAIT: Distance walked: 90 ft Assistive device utilized: None Level of assistance: Complete Independence Comments: increased R foot pronation and out toeing and R tibial ER, R LE valgus  TODAY'S TREATMENT:  DATE:   03/23/22 Therapeutic Exercise: to improve flexibility, strength and mobility.  Verbal and tactile cues throughout for technique Nustep L5x67min Standing rows RTB 2x10 Standing shoulder extension 2x10 RTB Standing pallof press reviewed Seated ab sets x 10 - progressed 2nd set with beach ball x 10  Seated clams RTB 2x10 Seated ER x 10 AROM bil- progressed 2nd set with RTB x 10   03/16/22 Therapeutic Exercise: to improve flexibility, strength and mobility.  Verbal and tactile cues throughout for technique Nustep L4x21min Seated R LE heel slides with slider x 20  Supine TrA bracing 2x10 Supine bridge with TrA bracing x 10 5 sec hold Supine clams with TrA bracing 2x10 5 sec hold Standing pallof press RTB x 20 both sides Standing rows RTB 2x10 Standing lat pulls 15lb x 10  03/14/22 Therapeutic Exercise: to improve flexibility, strength and mobility.  Verbal and tactile cues throughout for technique Rec Bike L1x62min Seated heel slides x 10 RLE Seated lumbar flexion stretch orange pball 3x15 sec Seated TrA set with orange pball x 10 5 sec hold Supine TrA brace /PPT x 10 5 sec Hooklying clams 1 set no weight: 2nd set RTB x 10 5 sec hold bil Supine march x 10 5 sec hold bil RTB Standing pallof press RTB x 20  03/09/22 LEFS AND 5XSTS completed Therapeutic Exercise:  to improve flexibility, strength and mobility.  Verbal and tactile cues throughout for technique Supine quad stretch with strap 2x30 sec bil Supine piriformis stretch 2x30 sec ea 2 versions TrAb with pelvic floor 10 x 5 sec hold Bridge with pelvic floor x 10  Therapeutic Activities: Body mechanics:  Went throught sit to stand and sit <> supine transfers, Reviewed most of body mechanics handout (still need to do last two pages and lifting techniques), discussed how to add weights to sit to stands to make more challenging.    03/07/22  Initial eval Supine hip flexor/quad stretch, modified thomas position R 1x30 sec hold Seated lumbar flexion stretch 1x30 sec hold  PATIENT EDUCATION:  Education details: HEP update and posture and body mechanics for typical daily postioning, mobility and household tasks  Person educated: Patient Education method: Explanation, Demonstration, and Handouts Education comprehension: verbalized understanding and returned demonstration  HOME EXERCISE PROGRAM: Access Code: [email or web address] URL: [email or web address] Date: 03/09/2022 Prepared by: Raynelle Fanning  Exercises - Seated Flexion Stretch  - 1 x daily - 7 x weekly - 4-5 reps - 20 sec hold - Supine Quadriceps Stretch with Strap on Table  - 2 x daily - 7 x weekly - 2 sets - 30 sec hold - Supine Piriformis Stretch with Foot on Ground  - 2 x daily - 7 x weekly - 1 sets - 3 reps - 30 sec hold - Supine Transversus Abdominis Bracing with Pelvic Floor Contraction  - 3 x daily - 7 x weekly - 1 sets - 10 reps - 5 sec hold - Supine Bridge with Pelvic Floor Contraction  - 2 x daily - 7 x weekly - 1 sets - 10 reps - Sit to Stand  - 3 x daily - 7 x weekly - 1-2 sets - 10 reps  Patient Education - Posture and Body Mechanics    ASSESSMENT:  CLINICAL IMPRESSION: Pt responded well as we continued to progress strengthening exercises. She continues to report R knee pain and shows genu valgus with toes out naturally.  Focused more on strengthening R hip external rotators to improve alignment of R hip and knee. Gave instruction  on engaging the scapula with rows and shld extensions. Will continue progressing exercises to address functional impairments.   OBJECTIVE IMPAIRMENTS: Abnormal gait, decreased balance, decreased cognition, decreased coordination, decreased endurance, decreased knowledge of condition, decreased mobility, difficulty walking, decreased ROM, decreased strength, decreased safety awareness, hypomobility, increased fascial restrictions, impaired perceived functional ability, impaired flexibility, impaired sensation, improper body mechanics, postural dysfunction, and pain.   ACTIVITY LIMITATIONS: carrying, lifting, bending, standing, squatting, stairs, transfers, and locomotion level  PARTICIPATION LIMITATIONS: meal prep, cleaning, laundry, driving, shopping, community activity, and walking around neighborhood  PERSONAL FACTORS: Age, Fitness, Past/current experiences, Time since onset of injury/illness/exacerbation, and 3+ comorbidities: L neck lymph node resection, GERD, L facial paresthesia, metabolic syndrome  are also affecting patient's functional outcome.   REHAB POTENTIAL: Good  CLINICAL DECISION MAKING: Stable/uncomplicated  EVALUATION COMPLEXITY: Low   GOALS: Goals reviewed with patient? Yes  SHORT TERM GOALS: Target date: 03/28/22  Pt will be independent with initial HEP.  Baseline: Goal status: MET- 03/23/22- with mild clarification needed  LONG TERM GOALS: Target date: 04/18/22  Pt will be independent with advanced/ongoing HEP to improve outcomes/carryover.  Baseline:  Goal status: IN PROGRESS  2.  Pt will report at least 75% improvement in knee and back pain to improve QOL.  Baseline:  Goal status: IN PROGRESS  3.  Pt will score </= 22% on Modified Oswestry to demonstrate improved functional ability. Baseline: 14 / 50 = 28.0 % Goal status: IN PROGRESS  4.  Pt  will amb >15 minutes with normal gait pattern without increased pain to access community or perform leisure activity of walking in neighborhood. Baseline:  Goal status: IN PROGRESS  5.  Pt will demonstrate improvement in BLE strength >/= 4/5 with reduced pain to increase mobility and stability.  Baseline:  Goal status: IN PROGRESS  6.  Pt will be able to ascend/descend stairs with 1 HR and reciprocal step pattern safely to access home and community.    Baseline:  Goal status: IN PROGRESS  7.  Pt will score <20 sec on 5xSTS to improve risk for falls and increase functional strength to improve QOL.  Baseline: 12.82 sec on 03/09/22 Goal status: MET  8.  Pt will report an improve LEFS score by 9 points to demonstrate improved functional ability.   Baseline: 52 / 80 = 65.0 % on 03/09/22 Goal status: IN PROGRESS    PLAN:  PT FREQUENCY: 2x/week  PT DURATION: 6 weeks  PLANNED INTERVENTIONS: Therapeutic exercises, Therapeutic activity, Neuromuscular re-education, Balance training, Gait training, Patient/Family education, Self Care, Joint mobilization, Joint manipulation, Stair training, Aquatic Therapy, Dry Needling, Electrical stimulation, Spinal manipulation, Spinal mobilization, Cryotherapy, Moist heat, Vasopneumatic device, Traction, Ultrasound, Ionotophoresis 4mg /ml Dexamethasone, Manual therapy, and Re-evaluation.  PLAN FOR NEXT SESSION:  proximal hip muscle strengthening focusing on external rotation and glutes /flexibility, lumbar flexion preference   Darleene Cleaver, PTA 03/23/2022, 10:19 AM  PHYSICAL THERAPY DISCHARGE SUMMARY  Visits from Start of Care: 5  Current functional level related to goals / functional outcomes:   Refer to above clinical impression and goal assessment for status as of last visit on 03/23/2022. Patient was unable to return due to issues with her insurance, therefore will proceed with discharge from PT for this episode.     Remaining deficits:   As  above. Unable to formally assess status at discharge due to failure to return to PT.    Education / Equipment:   HEP   Patient agrees to discharge. Patient goals were  partially met. Patient is being discharged due to financial reasons.  Marry GuanJoAnne M. Kreis, PT, MPT 07/05/22, 12:25 PM  [facility] [place] [address]  [address] [place], [place], [address] Phone: [phone]   Fax:  [phone]

## 2022-04-06 ENCOUNTER — Ambulatory Visit: Payer: Medicare Other

## 2022-04-18 ENCOUNTER — Encounter: Payer: Medicare Other | Admitting: Physical Therapy

## 2023-01-31 ENCOUNTER — Other Ambulatory Visit: Payer: Self-pay | Admitting: Obstetrics & Gynecology

## 2023-01-31 DIAGNOSIS — Z1231 Encounter for screening mammogram for malignant neoplasm of breast: Secondary | ICD-10-CM

## 2023-03-13 ENCOUNTER — Ambulatory Visit (INDEPENDENT_AMBULATORY_CARE_PROVIDER_SITE_OTHER): Payer: Medicare Other | Admitting: Podiatry

## 2023-03-13 DIAGNOSIS — Z91199 Patient's noncompliance with other medical treatment and regimen due to unspecified reason: Secondary | ICD-10-CM

## 2023-03-13 NOTE — Progress Notes (Signed)
Patient left appointment as she was not aware that pedorthotist was not here today

## 2023-03-20 ENCOUNTER — Ambulatory Visit
Admission: RE | Admit: 2023-03-20 | Discharge: 2023-03-20 | Disposition: A | Discharge status: Home / Self Care | Payer: Medicare Other | Source: Ambulatory Visit | Attending: Obstetrics & Gynecology | Admitting: Obstetrics & Gynecology

## 2023-03-20 DIAGNOSIS — Z1231 Encounter for screening mammogram for malignant neoplasm of breast: Secondary | ICD-10-CM

## 2023-05-17 ENCOUNTER — Encounter: Payer: Self-pay | Admitting: Genetic Counselor

## 2023-08-31 ENCOUNTER — Other Ambulatory Visit: Payer: Self-pay | Admitting: Obstetrics & Gynecology

## 2023-08-31 DIAGNOSIS — Z1231 Encounter for screening mammogram for malignant neoplasm of breast: Secondary | ICD-10-CM

## 2024-03-04 ENCOUNTER — Ambulatory Visit
Admission: RE | Admit: 2024-03-04 | Discharge: 2024-03-04 | Disposition: A | Source: Ambulatory Visit | Attending: Obstetrics & Gynecology | Admitting: Obstetrics & Gynecology

## 2024-03-04 DIAGNOSIS — Z1231 Encounter for screening mammogram for malignant neoplasm of breast: Secondary | ICD-10-CM

## 2024-03-25 ENCOUNTER — Ambulatory Visit
